# Patient Record
Sex: Female | Born: 1975
Health system: Southern US, Community
[De-identification: ages and names within clinical notes are randomized; demographics above are authoritative.]

## PROBLEM LIST (undated history)

## (undated) DIAGNOSIS — M5020 Other cervical disc displacement, unspecified cervical region: Secondary | ICD-10-CM

## (undated) DIAGNOSIS — M5030 Other cervical disc degeneration, unspecified cervical region: Secondary | ICD-10-CM

## (undated) DIAGNOSIS — M1990 Unspecified osteoarthritis, unspecified site: Secondary | ICD-10-CM

## (undated) HISTORY — DX: Unspecified osteoarthritis, unspecified site: M19.90

## (undated) HISTORY — DX: Other cervical disc displacement, unspecified cervical region: M50.20

## (undated) HISTORY — DX: Other cervical disc degeneration, unspecified cervical region: M50.30

---

## 2005-04-12 ENCOUNTER — Inpatient Hospital Stay: Payer: Self-pay | Admitting: Unknown Physician Specialty

## 2012-11-14 DIAGNOSIS — M1990 Unspecified osteoarthritis, unspecified site: Secondary | ICD-10-CM

## 2012-11-14 HISTORY — DX: Unspecified osteoarthritis, unspecified site: M19.90

## 2013-11-28 ENCOUNTER — Encounter: Payer: Self-pay | Admitting: General Practice

## 2013-12-15 ENCOUNTER — Encounter: Payer: Self-pay | Admitting: General Practice

## 2014-01-12 ENCOUNTER — Encounter: Payer: Self-pay | Admitting: General Practice

## 2014-02-12 ENCOUNTER — Encounter: Payer: Self-pay | Admitting: General Practice

## 2014-04-11 DIAGNOSIS — M4802 Spinal stenosis, cervical region: Secondary | ICD-10-CM | POA: Insufficient documentation

## 2014-04-11 DIAGNOSIS — M5020 Other cervical disc displacement, unspecified cervical region: Secondary | ICD-10-CM | POA: Insufficient documentation

## 2014-04-11 DIAGNOSIS — M5412 Radiculopathy, cervical region: Secondary | ICD-10-CM | POA: Insufficient documentation

## 2014-09-24 DIAGNOSIS — S335XXA Sprain of ligaments of lumbar spine, initial encounter: Secondary | ICD-10-CM | POA: Insufficient documentation

## 2014-10-10 ENCOUNTER — Ambulatory Visit: Payer: Self-pay | Admitting: Physical Medicine and Rehabilitation

## 2014-10-21 DIAGNOSIS — M5136 Other intervertebral disc degeneration, lumbar region: Secondary | ICD-10-CM | POA: Insufficient documentation

## 2014-10-27 ENCOUNTER — Encounter: Payer: Self-pay | Admitting: Physical Medicine and Rehabilitation

## 2014-11-14 ENCOUNTER — Encounter: Payer: Self-pay | Admitting: Physical Medicine and Rehabilitation

## 2014-12-15 ENCOUNTER — Encounter: Payer: Self-pay | Admitting: Physical Medicine and Rehabilitation

## 2015-01-13 ENCOUNTER — Encounter: Payer: Self-pay | Admitting: Physical Medicine and Rehabilitation

## 2017-03-30 ENCOUNTER — Ambulatory Visit (INDEPENDENT_AMBULATORY_CARE_PROVIDER_SITE_OTHER): Payer: BLUE CROSS/BLUE SHIELD | Admitting: Certified Nurse Midwife

## 2017-03-30 ENCOUNTER — Encounter: Payer: Self-pay | Admitting: Certified Nurse Midwife

## 2017-03-30 VITALS — BP 110/70 | HR 78 | Ht 64.0 in | Wt 177.0 lb

## 2017-03-30 DIAGNOSIS — Z01419 Encounter for gynecological examination (general) (routine) without abnormal findings: Secondary | ICD-10-CM | POA: Diagnosis not present

## 2017-03-30 DIAGNOSIS — Z124 Encounter for screening for malignant neoplasm of cervix: Secondary | ICD-10-CM
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
## 2017-04-05 HISTORY — PX: COLONOSCOPY W/ POLYPECTOMY: SHX1380

## 2017-04-05 LAB — IGP,RFX APTIMA HPV ALL PTH: PAP Smear Comment: 0

## 2017-04-20 ENCOUNTER — Other Ambulatory Visit: Payer: Self-pay | Admitting: Certified Nurse Midwife

## 2017-04-20 DIAGNOSIS — Z1231 Encounter for screening mammogram for malignant neoplasm of breast: Secondary | ICD-10-CM

## 2017-05-05 ENCOUNTER — Telehealth: Payer: Self-pay

## 2017-05-05 ENCOUNTER — Ambulatory Visit
Admission: RE | Admit: 2017-05-05 | Discharge: 2017-05-05 | Disposition: A | Discharge status: Home / Self Care | Payer: BLUE CROSS/BLUE SHIELD | Source: Ambulatory Visit | Attending: Certified Nurse Midwife | Admitting: Certified Nurse Midwife

## 2017-05-05 DIAGNOSIS — Z1231 Encounter for screening mammogram for malignant neoplasm of breast: Secondary | ICD-10-CM | POA: Insufficient documentation

## 2017-05-05 NOTE — Telephone Encounter (Signed)
Pt stated she had a mammogram done here about 5 years ago and she is requesting to pick up a copy of the images on a CD if possible to take to [facility] to compare to her most recent mammogram. Pt would like this as soon as possible b/c her insurance is about to run out and if she has to have more test done she would like to do that before her insurance changes. Please advise. Thanks TNP

## 2017-05-09 ENCOUNTER — Inpatient Hospital Stay
Admission: RE | Admit: 2017-05-09 | Discharge: 2017-05-09 | Disposition: A | Discharge status: Home / Self Care | Payer: Self-pay | Source: Ambulatory Visit | Attending: *Deleted | Admitting: *Deleted

## 2017-05-09 ENCOUNTER — Other Ambulatory Visit: Payer: Self-pay | Admitting: *Deleted

## 2017-05-09 DIAGNOSIS — Z9289 Personal history of other medical treatment: Secondary | ICD-10-CM

## 2017-05-09 NOTE — Telephone Encounter (Signed)
Spoke w/pt. Notified that her Mammogram reports & CD will be available for [place] to p/u from our front desk today. [place] aware & Pt does not need to p/u herself.

## 2017-05-11 ENCOUNTER — Ambulatory Visit: Payer: Self-pay

## 2017-05-12 ENCOUNTER — Telehealth: Payer: Self-pay

## 2017-05-12 NOTE — Telephone Encounter (Signed)
Pt calling for report from [place].  Her deductible that she has met runs out today.  Wants to be sure she doesn't have to have the image redone.  [phone].   Pt aware mammogram neg.

## 2017-06-02 ENCOUNTER — Ambulatory Visit
Admission: RE | Admit: 2017-06-02 | Discharge: 2017-06-02 | Disposition: A | Discharge status: Home / Self Care | Payer: Worker's Compensation | Source: Ambulatory Visit | Attending: Family Medicine | Admitting: Family Medicine

## 2017-06-02 ENCOUNTER — Other Ambulatory Visit: Payer: Self-pay | Admitting: Family Medicine

## 2017-06-02 DIAGNOSIS — R52 Pain, unspecified: Secondary | ICD-10-CM

## 2017-06-02 DIAGNOSIS — W19XXXA Unspecified fall, initial encounter: Secondary | ICD-10-CM | POA: Insufficient documentation

## 2017-06-02 DIAGNOSIS — M25511 Pain in right shoulder: Secondary | ICD-10-CM | POA: Diagnosis not present

## 2017-07-11 ENCOUNTER — Ambulatory Visit: Payer: Worker's Compensation | Attending: Family Medicine

## 2017-07-11 DIAGNOSIS — M25511 Pain in right shoulder: Secondary | ICD-10-CM | POA: Insufficient documentation

## 2017-07-11 DIAGNOSIS — M25611 Stiffness of right shoulder, not elsewhere classified: Secondary | ICD-10-CM | POA: Diagnosis not present
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
## 2017-07-19 ENCOUNTER — Ambulatory Visit: Payer: Worker's Compensation | Attending: Family Medicine

## 2017-07-19 DIAGNOSIS — M25511 Pain in right shoulder: Secondary | ICD-10-CM | POA: Diagnosis present

## 2017-07-19 DIAGNOSIS — M25611 Stiffness of right shoulder, not elsewhere classified: Secondary | ICD-10-CM | POA: Diagnosis not present
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
## 2017-07-25 ENCOUNTER — Ambulatory Visit: Payer: Worker's Compensation

## 2017-07-25 DIAGNOSIS — M25611 Stiffness of right shoulder, not elsewhere classified: Secondary | ICD-10-CM | POA: Diagnosis not present

## 2017-07-25 DIAGNOSIS — M25511 Pain in right shoulder: Secondary | ICD-10-CM
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
## 2017-08-03 ENCOUNTER — Ambulatory Visit: Payer: Worker's Compensation | Admitting: Physical Therapy

## 2017-08-03 ENCOUNTER — Encounter: Payer: Self-pay | Admitting: Physical Therapy

## 2017-08-03 DIAGNOSIS — M25611 Stiffness of right shoulder, not elsewhere classified: Secondary | ICD-10-CM

## 2017-08-03 DIAGNOSIS — M25511 Pain in right shoulder: Secondary | ICD-10-CM
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
## 2017-08-07 ENCOUNTER — Ambulatory Visit: Payer: Worker's Compensation

## 2017-08-07 DIAGNOSIS — M25511 Pain in right shoulder: Secondary | ICD-10-CM

## 2017-08-07 DIAGNOSIS — M25611 Stiffness of right shoulder, not elsewhere classified: Secondary | ICD-10-CM | POA: Diagnosis not present
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
## 2017-08-10 ENCOUNTER — Ambulatory Visit: Payer: Worker's Compensation

## 2017-08-10 DIAGNOSIS — M25611 Stiffness of right shoulder, not elsewhere classified: Secondary | ICD-10-CM

## 2017-08-10 DIAGNOSIS — M25511 Pain in right shoulder: Secondary | ICD-10-CM
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
## 2017-08-23 ENCOUNTER — Ambulatory Visit: Payer: Worker's Compensation | Attending: Family Medicine

## 2017-08-23 DIAGNOSIS — M25611 Stiffness of right shoulder, not elsewhere classified: Secondary | ICD-10-CM | POA: Insufficient documentation

## 2017-08-23 DIAGNOSIS — M25511 Pain in right shoulder: Secondary | ICD-10-CM | POA: Insufficient documentation
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
## 2017-08-30 ENCOUNTER — Ambulatory Visit: Payer: Worker's Compensation

## 2017-08-30 DIAGNOSIS — M25511 Pain in right shoulder: Secondary | ICD-10-CM

## 2017-08-30 DIAGNOSIS — M25611 Stiffness of right shoulder, not elsewhere classified: Secondary | ICD-10-CM
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
## 2017-09-04 ENCOUNTER — Ambulatory Visit: Payer: Worker's Compensation

## 2017-09-04 DIAGNOSIS — M25611 Stiffness of right shoulder, not elsewhere classified: Secondary | ICD-10-CM

## 2017-09-04 DIAGNOSIS — M25511 Pain in right shoulder: Secondary | ICD-10-CM
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
## 2017-09-11 ENCOUNTER — Ambulatory Visit: Payer: Worker's Compensation

## 2017-09-11 DIAGNOSIS — M25511 Pain in right shoulder: Secondary | ICD-10-CM | POA: Diagnosis not present

## 2017-09-11 DIAGNOSIS — M25611 Stiffness of right shoulder, not elsewhere classified: Secondary | ICD-10-CM
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
## 2017-09-25 ENCOUNTER — Ambulatory Visit: Payer: Worker's Compensation | Attending: Family Medicine

## 2017-09-25 ENCOUNTER — Other Ambulatory Visit: Payer: Self-pay

## 2017-09-25 DIAGNOSIS — M25511 Pain in right shoulder: Secondary | ICD-10-CM | POA: Diagnosis present

## 2017-09-25 DIAGNOSIS — M25611 Stiffness of right shoulder, not elsewhere classified: Secondary | ICD-10-CM | POA: Diagnosis not present
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
## 2017-10-04 ENCOUNTER — Ambulatory Visit: Payer: Worker's Compensation

## 2017-10-18 ENCOUNTER — Ambulatory Visit: Payer: Worker's Compensation | Attending: Family Medicine

## 2017-10-18 DIAGNOSIS — M25511 Pain in right shoulder: Secondary | ICD-10-CM | POA: Diagnosis present

## 2017-10-18 DIAGNOSIS — M25611 Stiffness of right shoulder, not elsewhere classified: Secondary | ICD-10-CM | POA: Insufficient documentation
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
## 2017-10-31 ENCOUNTER — Ambulatory Visit: Payer: Worker's Compensation

## 2017-10-31 DIAGNOSIS — M25511 Pain in right shoulder: Secondary | ICD-10-CM | POA: Diagnosis not present

## 2017-10-31 DIAGNOSIS — M25611 Stiffness of right shoulder, not elsewhere classified: Secondary | ICD-10-CM
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
## 2017-11-16 ENCOUNTER — Ambulatory Visit: Payer: Worker's Compensation | Attending: Family Medicine

## 2017-11-16 DIAGNOSIS — M25511 Pain in right shoulder: Secondary | ICD-10-CM | POA: Diagnosis not present

## 2017-11-16 DIAGNOSIS — M25611 Stiffness of right shoulder, not elsewhere classified: Secondary | ICD-10-CM | POA: Diagnosis present
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
## 2017-11-20 ENCOUNTER — Ambulatory Visit: Payer: Worker's Compensation

## 2017-11-29 ENCOUNTER — Ambulatory Visit: Payer: Worker's Compensation

## 2017-12-01 IMAGING — CR DG SHOULDER 2+V*R*
1 series · 3 of 3 positions shown · non-contrast
Comparison: None.

CLINICAL DATA: Slip and fall 2 months ago with right shoulder pain,
initial encounter

EXAM:
RIGHT SHOULDER - 2+ VIEW

[Series 1: dg shoulder right · 0.14mm/px · 3 of 3 slices shown]
[im 1/3]
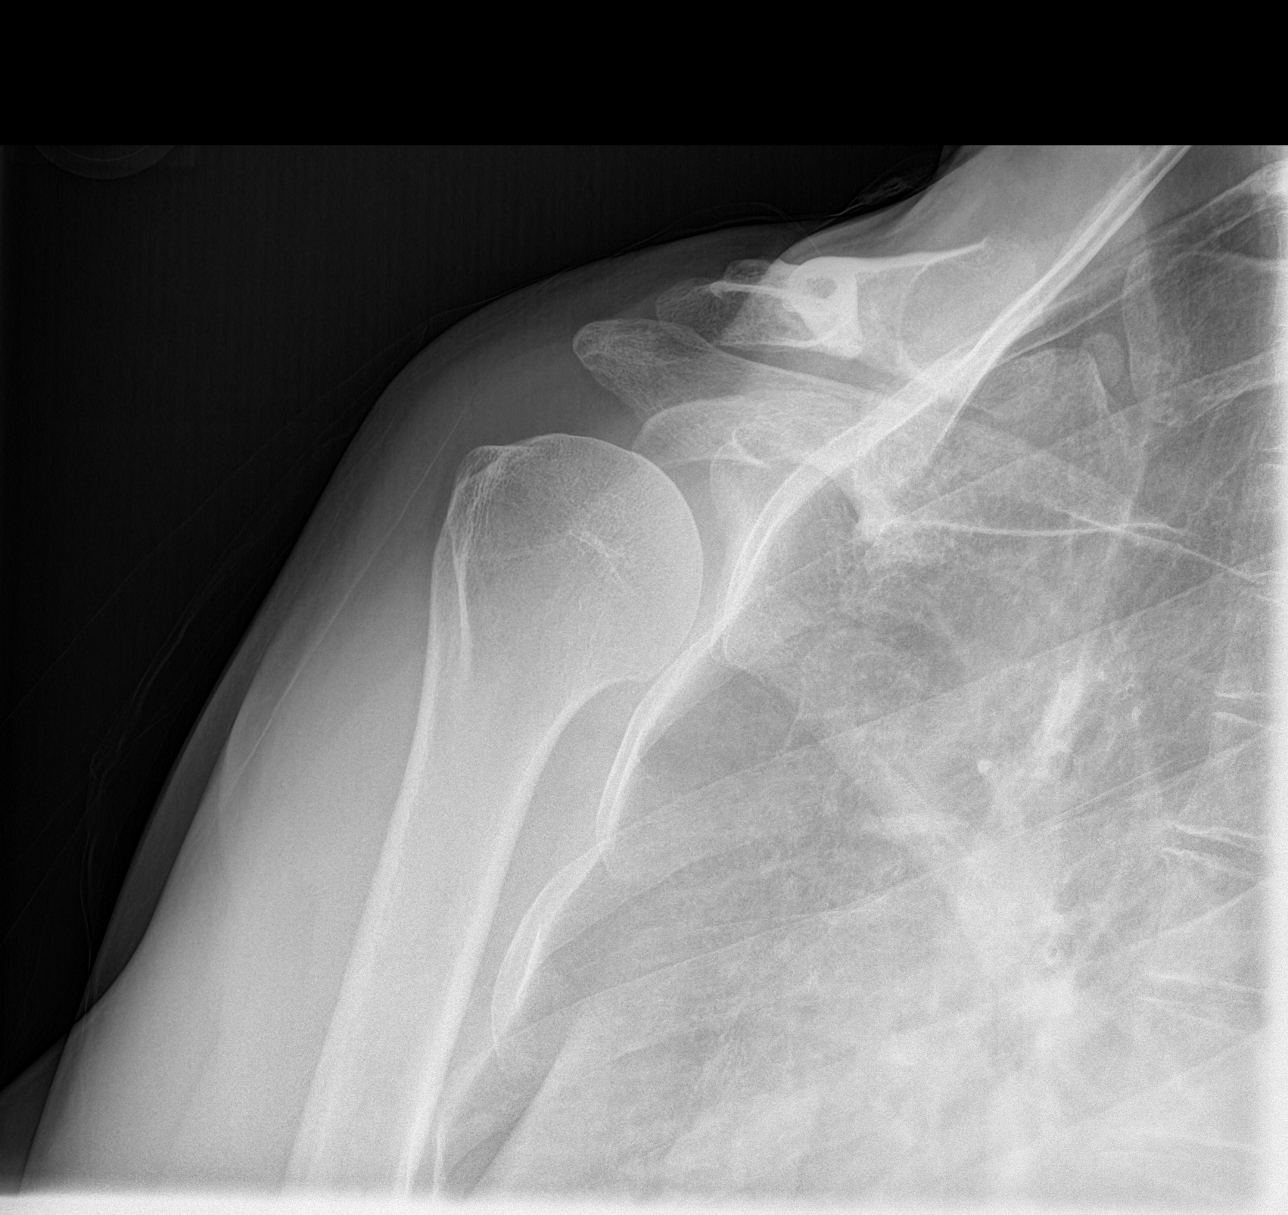
[im 2/3]
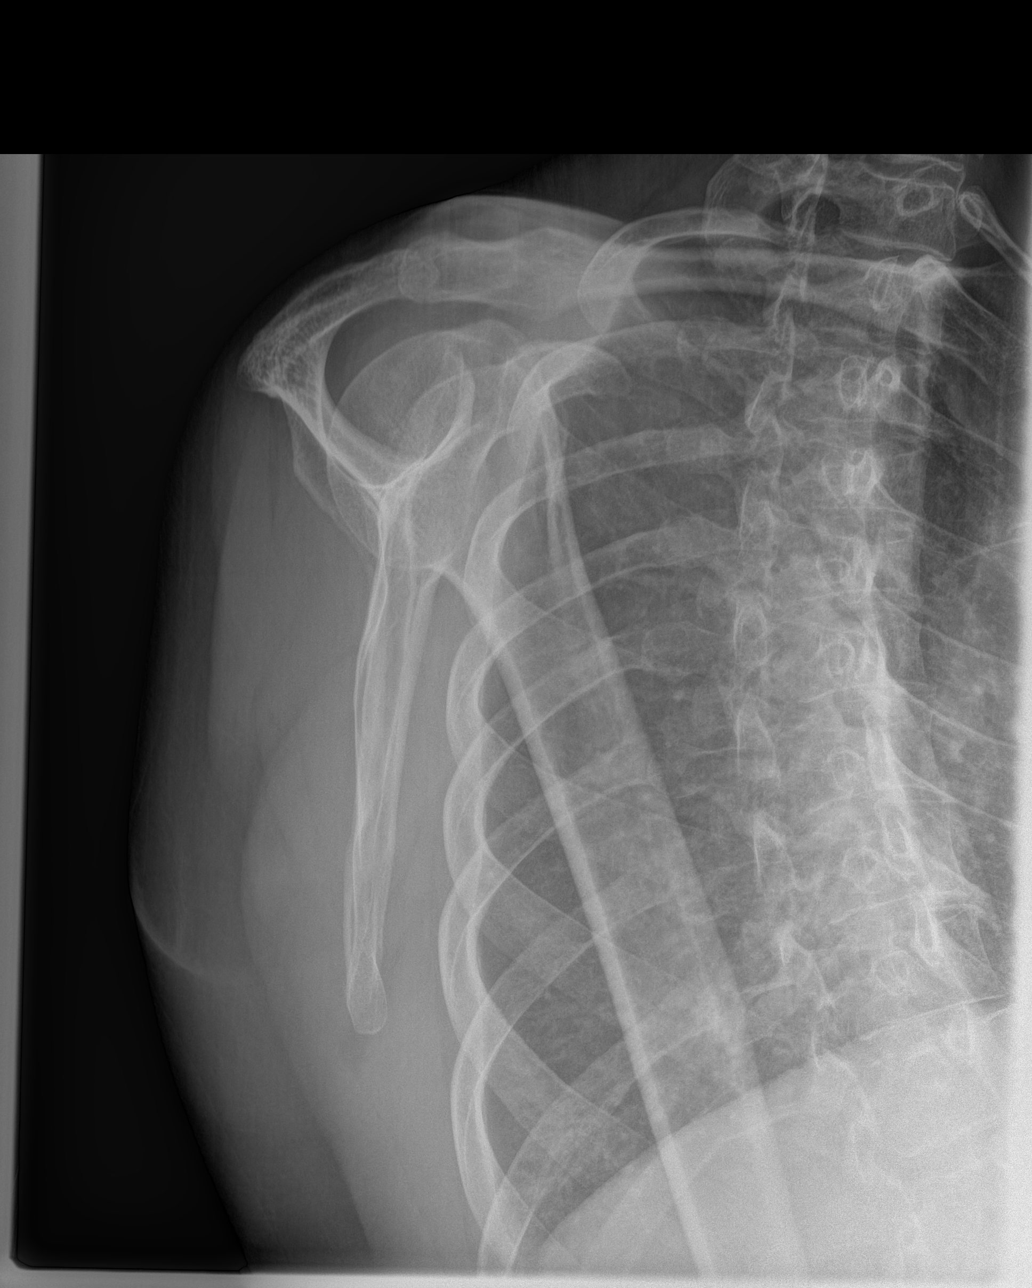
[im 3/3]
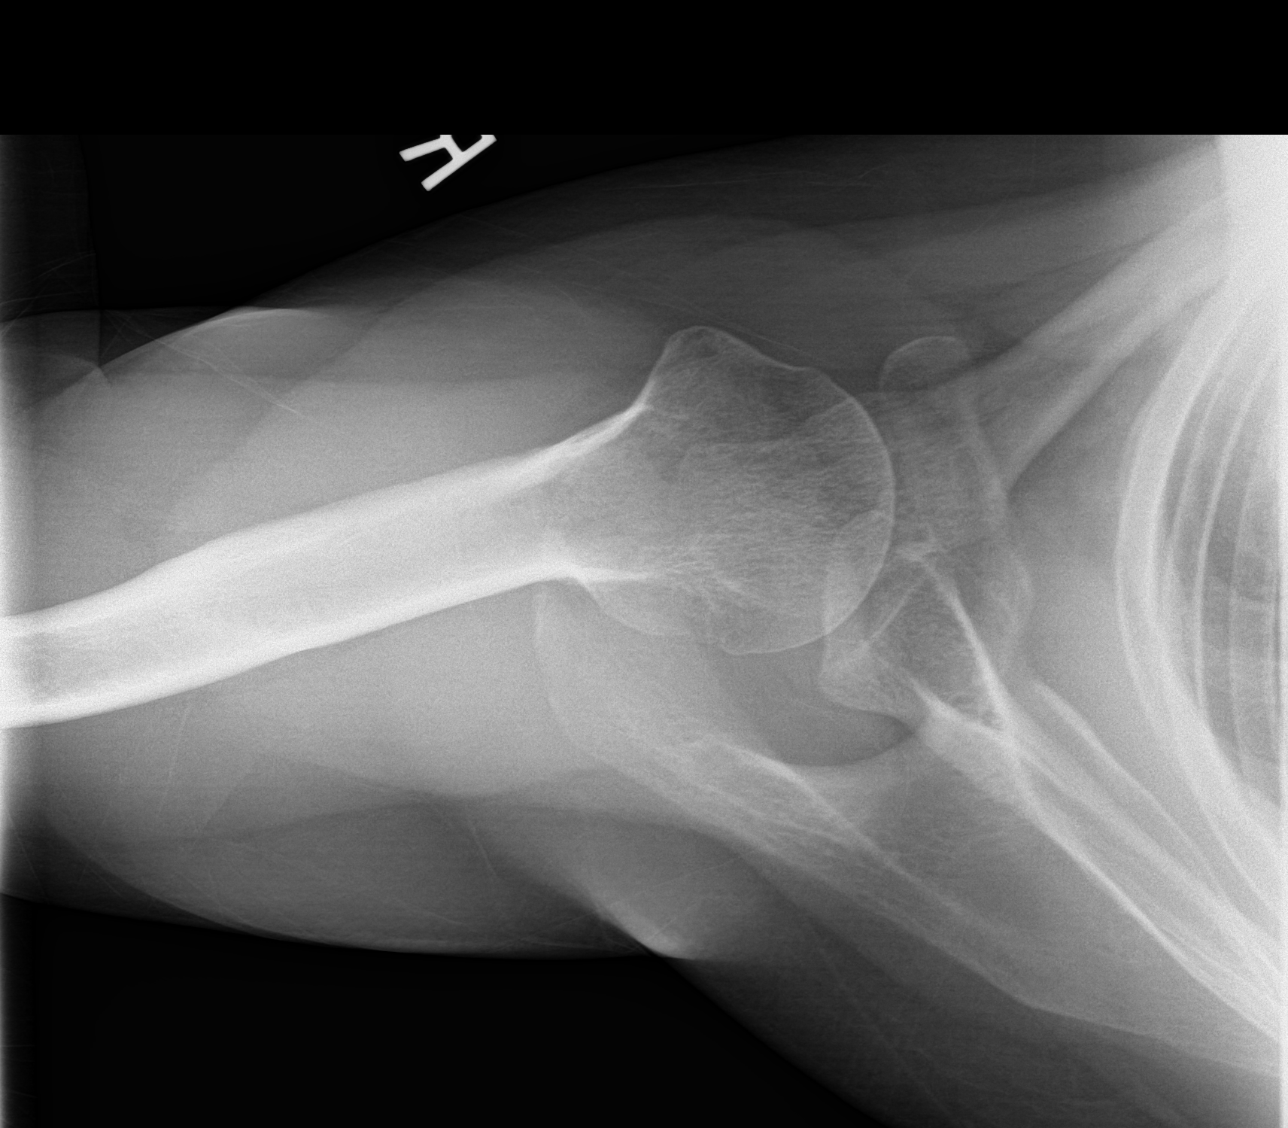

[3 of 3 positions shown; findings below may reference images not displayed]

FINDINGS: No acute fracture or dislocation is noted. No soft tissue
abnormality is seen. The underlying bony thorax is within normal
limits.
IMPRESSION: No acute abnormality noted.

## 2017-12-07 ENCOUNTER — Ambulatory Visit: Payer: Worker's Compensation

## 2017-12-07 DIAGNOSIS — M25611 Stiffness of right shoulder, not elsewhere classified: Secondary | ICD-10-CM

## 2017-12-07 DIAGNOSIS — M25511 Pain in right shoulder: Secondary | ICD-10-CM
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
## 2017-12-27 ENCOUNTER — Ambulatory Visit: Payer: Worker's Compensation | Attending: Family Medicine

## 2017-12-27 DIAGNOSIS — M25511 Pain in right shoulder: Secondary | ICD-10-CM | POA: Insufficient documentation

## 2017-12-27 DIAGNOSIS — M25611 Stiffness of right shoulder, not elsewhere classified: Secondary | ICD-10-CM | POA: Diagnosis not present
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
## 2018-01-16 ENCOUNTER — Ambulatory Visit: Payer: No Typology Code available for payment source | Attending: Family Medicine

## 2018-01-16 DIAGNOSIS — M25611 Stiffness of right shoulder, not elsewhere classified: Secondary | ICD-10-CM | POA: Diagnosis not present

## 2018-01-16 DIAGNOSIS — M25511 Pain in right shoulder: Secondary | ICD-10-CM | POA: Diagnosis present

## 2018-01-16 NOTE — Addendum Note (Signed)
Addended by: Blain Pais on: 01/16/2018 06:40 PM   Modules accepted: Orders
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
## 2018-01-23 ENCOUNTER — Ambulatory Visit: Payer: No Typology Code available for payment source

## 2018-01-23 DIAGNOSIS — M25511 Pain in right shoulder: Secondary | ICD-10-CM

## 2018-01-23 DIAGNOSIS — M25611 Stiffness of right shoulder, not elsewhere classified: Secondary | ICD-10-CM | POA: Diagnosis not present
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
## 2018-01-30 ENCOUNTER — Other Ambulatory Visit: Payer: Self-pay | Admitting: Certified Nurse Midwife

## 2018-01-30 ENCOUNTER — Ambulatory Visit: Payer: No Typology Code available for payment source

## 2018-01-30 ENCOUNTER — Other Ambulatory Visit: Payer: Self-pay | Admitting: Family Medicine

## 2018-01-30 DIAGNOSIS — Z1231 Encounter for screening mammogram for malignant neoplasm of breast: Secondary | ICD-10-CM

## 2018-01-30 DIAGNOSIS — M25611 Stiffness of right shoulder, not elsewhere classified: Secondary | ICD-10-CM

## 2018-01-30 DIAGNOSIS — M25511 Pain in right shoulder: Secondary | ICD-10-CM
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
## 2018-02-06 ENCOUNTER — Ambulatory Visit: Payer: No Typology Code available for payment source

## 2018-02-13 ENCOUNTER — Ambulatory Visit: Payer: No Typology Code available for payment source | Attending: Family Medicine

## 2018-02-13 DIAGNOSIS — M25511 Pain in right shoulder: Secondary | ICD-10-CM

## 2018-02-13 DIAGNOSIS — M25611 Stiffness of right shoulder, not elsewhere classified: Secondary | ICD-10-CM | POA: Insufficient documentation
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
## 2018-02-22 ENCOUNTER — Ambulatory Visit
Admission: RE | Admit: 2018-02-22 | Discharge: 2018-02-22 | Disposition: A | Discharge status: Home / Self Care | Payer: BLUE CROSS/BLUE SHIELD | Source: Ambulatory Visit | Attending: Certified Nurse Midwife | Admitting: Certified Nurse Midwife

## 2018-02-22 DIAGNOSIS — Z1231 Encounter for screening mammogram for malignant neoplasm of breast: Secondary | ICD-10-CM | POA: Insufficient documentation

## 2018-03-26 ENCOUNTER — Encounter: Payer: Self-pay | Admitting: Certified Nurse Midwife

## 2018-03-26 ENCOUNTER — Ambulatory Visit (INDEPENDENT_AMBULATORY_CARE_PROVIDER_SITE_OTHER): Payer: BLUE CROSS/BLUE SHIELD | Admitting: Certified Nurse Midwife

## 2018-03-26 VITALS — BP 112/76 | HR 78 | Ht 64.0 in | Wt 179.0 lb

## 2018-03-26 DIAGNOSIS — Z800 Family history of malignant neoplasm of digestive organs: Secondary | ICD-10-CM | POA: Insufficient documentation

## 2018-03-26 DIAGNOSIS — D369 Benign neoplasm, unspecified site: Secondary | ICD-10-CM | POA: Insufficient documentation

## 2018-03-26 DIAGNOSIS — Z01419 Encounter for gynecological examination (general) (routine) without abnormal findings: Secondary | ICD-10-CM

## 2018-03-26 DIAGNOSIS — M25511 Pain in right shoulder: Secondary | ICD-10-CM | POA: Insufficient documentation

## 2018-03-26 DIAGNOSIS — Z124 Encounter for screening for malignant neoplasm of cervix: Secondary | ICD-10-CM
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
## 2018-03-29 LAB — IGP,RFX APTIMA HPV ALL PTH: PAP Smear Comment: 0

## 2018-06-13 ENCOUNTER — Ambulatory Visit: Payer: No Typology Code available for payment source | Attending: Orthopedic Surgery

## 2018-06-13 DIAGNOSIS — M25611 Stiffness of right shoulder, not elsewhere classified: Secondary | ICD-10-CM | POA: Diagnosis not present

## 2018-06-13 DIAGNOSIS — M25511 Pain in right shoulder: Secondary | ICD-10-CM | POA: Diagnosis present
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
## 2018-06-19 ENCOUNTER — Ambulatory Visit: Payer: No Typology Code available for payment source | Attending: Orthopedic Surgery

## 2018-06-19 DIAGNOSIS — M25511 Pain in right shoulder: Secondary | ICD-10-CM | POA: Insufficient documentation

## 2018-06-19 DIAGNOSIS — M25611 Stiffness of right shoulder, not elsewhere classified: Secondary | ICD-10-CM | POA: Diagnosis present
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
## 2018-07-04 ENCOUNTER — Ambulatory Visit: Payer: No Typology Code available for payment source

## 2018-07-04 DIAGNOSIS — M25511 Pain in right shoulder: Secondary | ICD-10-CM | POA: Diagnosis not present

## 2018-07-04 DIAGNOSIS — M25611 Stiffness of right shoulder, not elsewhere classified: Secondary | ICD-10-CM
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
## 2018-07-11 ENCOUNTER — Ambulatory Visit: Payer: No Typology Code available for payment source

## 2018-07-11 DIAGNOSIS — M25511 Pain in right shoulder: Secondary | ICD-10-CM

## 2018-07-11 DIAGNOSIS — M25611 Stiffness of right shoulder, not elsewhere classified: Secondary | ICD-10-CM
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
## 2018-07-19 ENCOUNTER — Ambulatory Visit: Payer: No Typology Code available for payment source | Attending: Orthopedic Surgery

## 2018-07-19 DIAGNOSIS — M25511 Pain in right shoulder: Secondary | ICD-10-CM | POA: Diagnosis present

## 2018-07-19 DIAGNOSIS — M25611 Stiffness of right shoulder, not elsewhere classified: Secondary | ICD-10-CM
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
## 2018-07-25 ENCOUNTER — Ambulatory Visit: Payer: No Typology Code available for payment source

## 2018-07-25 DIAGNOSIS — M25611 Stiffness of right shoulder, not elsewhere classified: Secondary | ICD-10-CM | POA: Diagnosis not present

## 2018-07-25 DIAGNOSIS — M25511 Pain in right shoulder: Secondary | ICD-10-CM
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
## 2018-07-26 ENCOUNTER — Ambulatory Visit: Payer: No Typology Code available for payment source

## 2018-08-02 ENCOUNTER — Ambulatory Visit: Payer: No Typology Code available for payment source

## 2018-08-02 DIAGNOSIS — M25511 Pain in right shoulder: Secondary | ICD-10-CM

## 2018-08-02 DIAGNOSIS — M25611 Stiffness of right shoulder, not elsewhere classified: Secondary | ICD-10-CM | POA: Diagnosis not present
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
## 2018-08-09 ENCOUNTER — Ambulatory Visit: Payer: No Typology Code available for payment source

## 2018-08-09 DIAGNOSIS — M25611 Stiffness of right shoulder, not elsewhere classified: Secondary | ICD-10-CM | POA: Diagnosis not present

## 2018-08-09 DIAGNOSIS — M25511 Pain in right shoulder: Secondary | ICD-10-CM
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
## 2018-08-16 ENCOUNTER — Ambulatory Visit: Payer: No Typology Code available for payment source

## 2018-08-23 ENCOUNTER — Ambulatory Visit: Payer: No Typology Code available for payment source | Attending: Orthopedic Surgery

## 2018-08-23 DIAGNOSIS — M25511 Pain in right shoulder: Secondary | ICD-10-CM | POA: Diagnosis present

## 2018-08-23 DIAGNOSIS — M25611 Stiffness of right shoulder, not elsewhere classified: Secondary | ICD-10-CM | POA: Diagnosis present

## 2018-08-23 IMAGING — MG MM DIGITAL SCREENING BILAT W/ TOMO W/ CAD
8 of 13 series · 8 of 29 positions shown · non-contrast
Comparison: Previous exam(s).

CLINICAL DATA: Screening.

EXAM:
DIGITAL SCREENING BILATERAL MAMMOGRAM WITH TOMO AND CAD

[L XCCL]
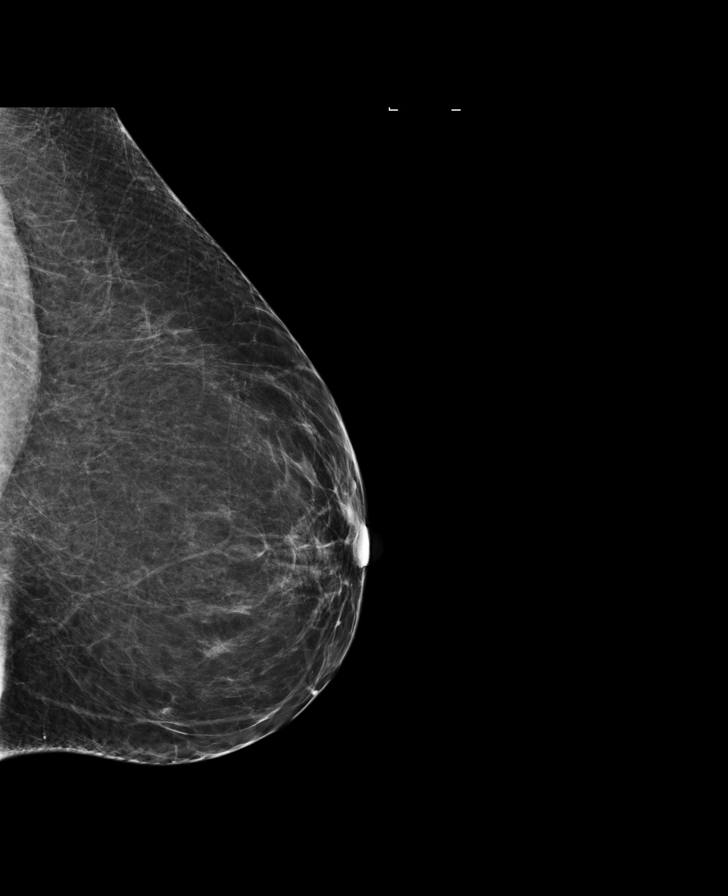

[R CC]
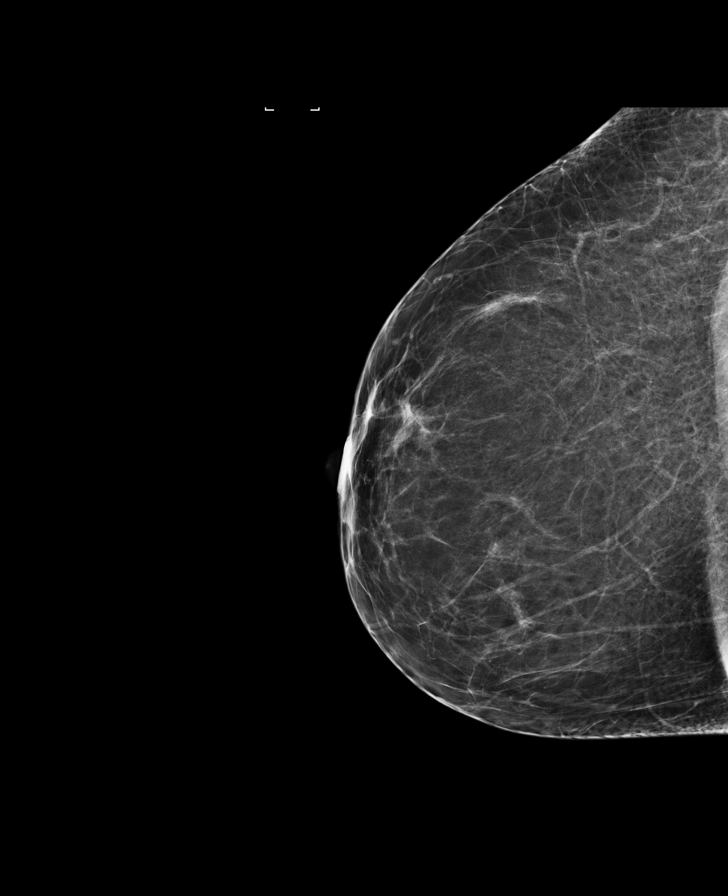

[L CC]
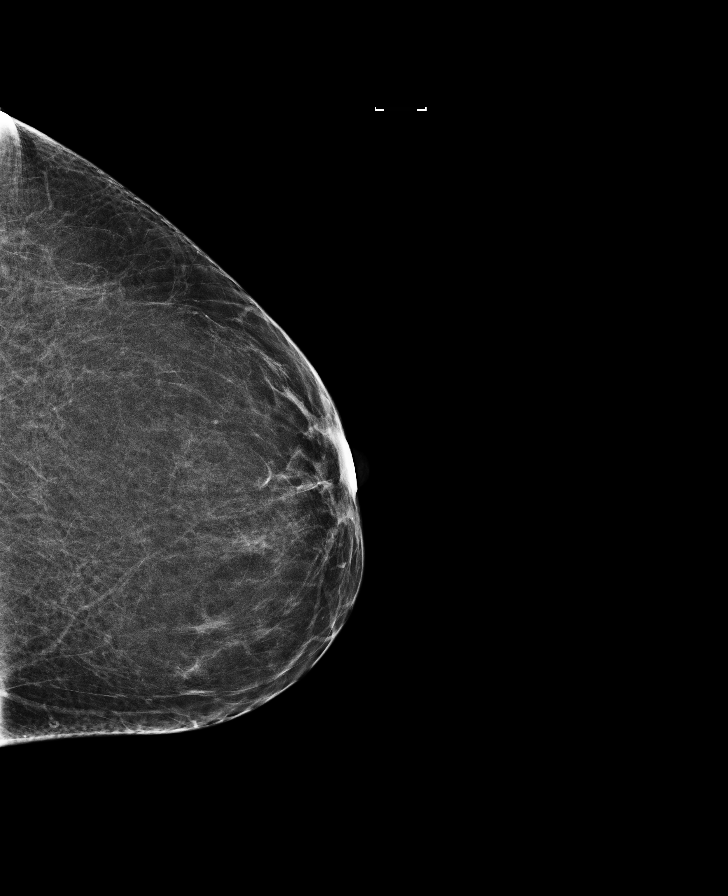

[R CC synth-2D]
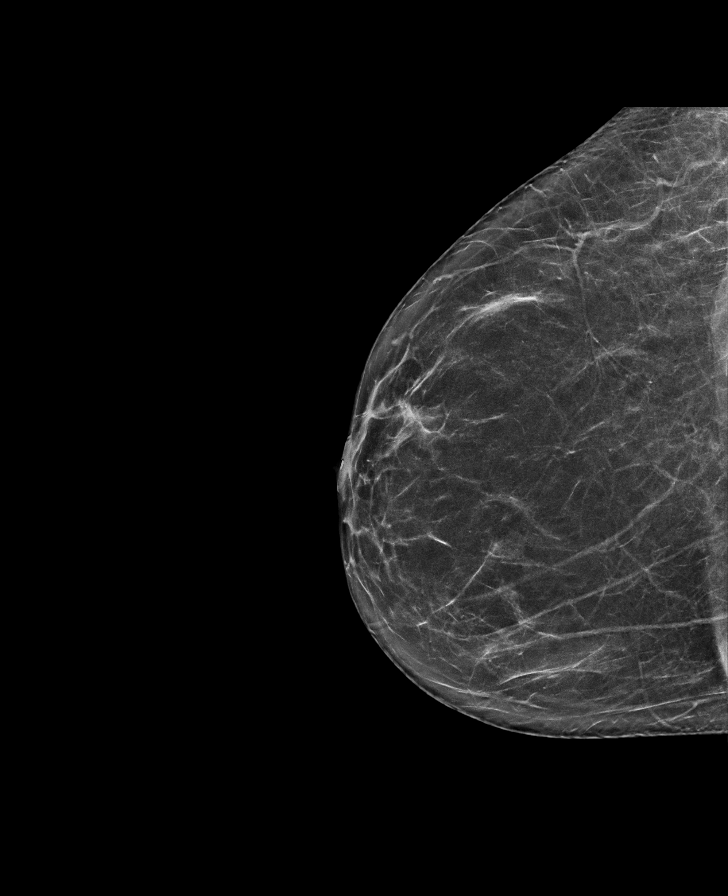

[L MLO synth-2D]
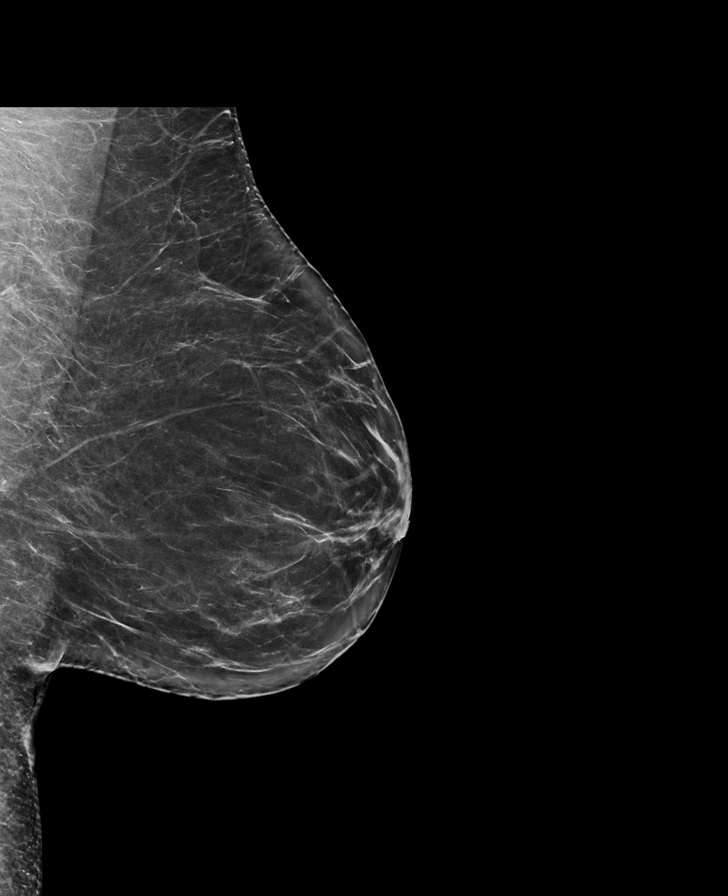

[L CC synth-2D]
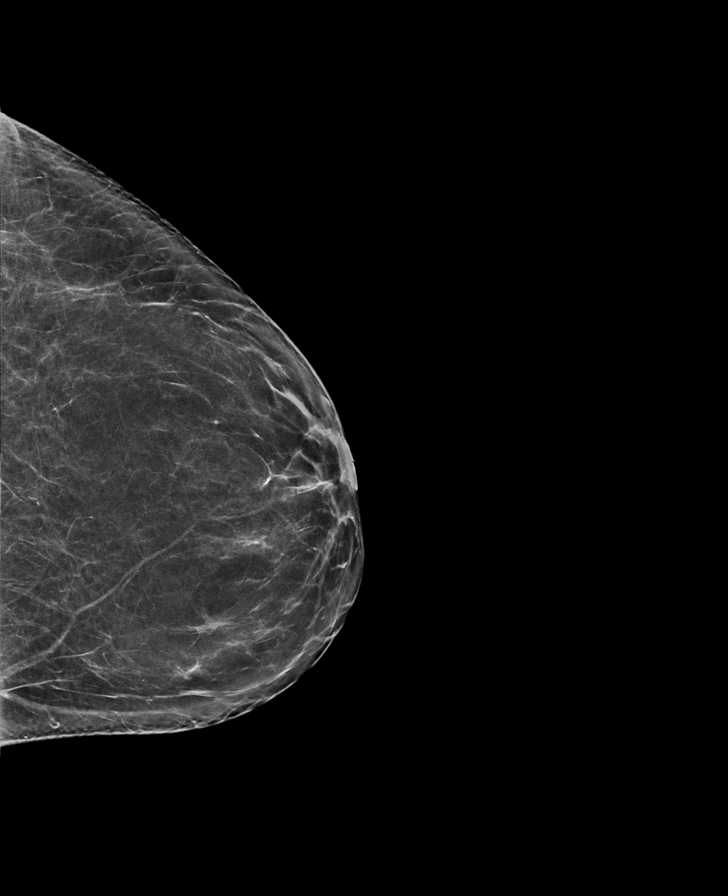

[R MLO]
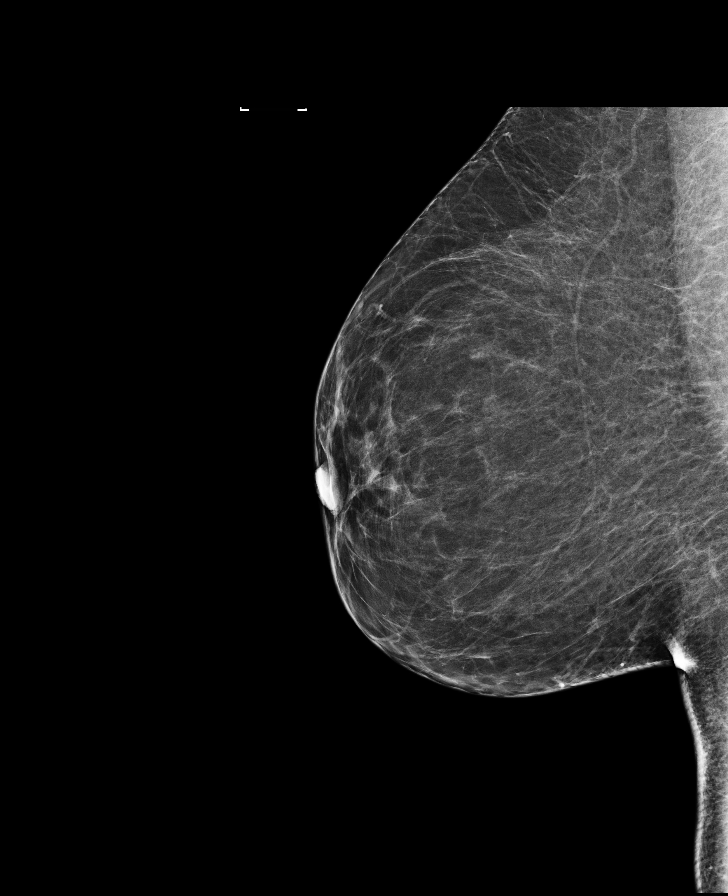

[R MLO synth-2D]
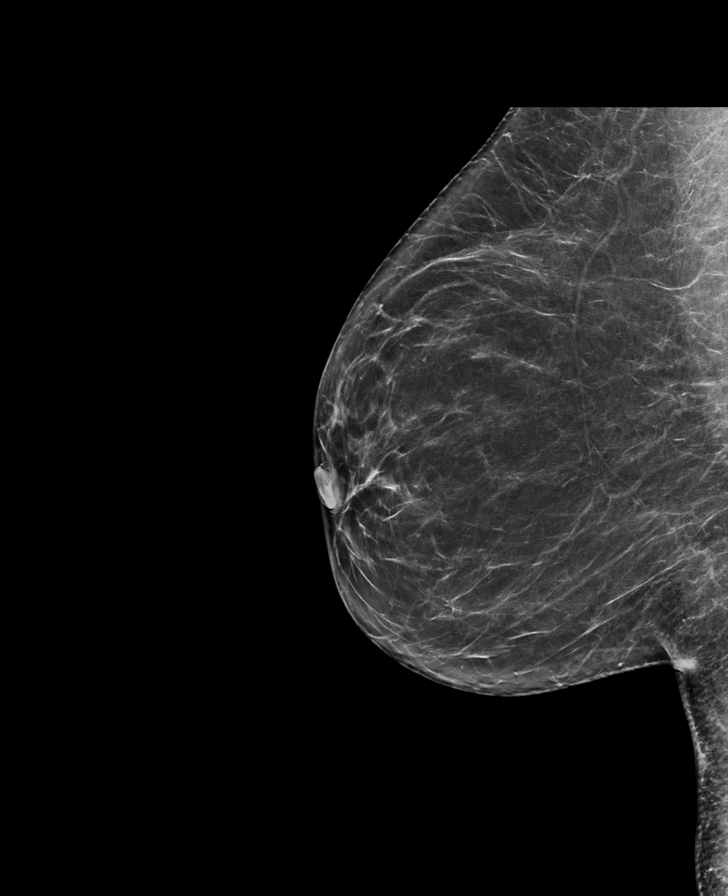

[8 of 29 positions shown; findings below may reference images not displayed]

ACR Breast Density Category b: There are scattered areas of
fibroglandular density.
FINDINGS: There are no findings suspicious for malignancy. Images were
processed with CAD.
IMPRESSION: No mammographic evidence of malignancy. A result letter of this
screening mammogram will be mailed directly to the patient.

RECOMMENDATION:
Screening mammogram in one year. (Code:[id number])

BI-RADS CATEGORY  1: Negative.
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
## 2018-08-30 ENCOUNTER — Ambulatory Visit: Payer: No Typology Code available for payment source

## 2018-08-30 DIAGNOSIS — M25611 Stiffness of right shoulder, not elsewhere classified: Secondary | ICD-10-CM

## 2018-08-30 DIAGNOSIS — M25511 Pain in right shoulder: Secondary | ICD-10-CM | POA: Diagnosis not present
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
## 2018-09-06 ENCOUNTER — Ambulatory Visit: Payer: No Typology Code available for payment source

## 2018-09-06 DIAGNOSIS — M25611 Stiffness of right shoulder, not elsewhere classified: Secondary | ICD-10-CM

## 2018-09-06 DIAGNOSIS — M25511 Pain in right shoulder: Secondary | ICD-10-CM | POA: Diagnosis not present
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
## 2018-09-12 ENCOUNTER — Ambulatory Visit: Payer: No Typology Code available for payment source

## 2018-10-01 ENCOUNTER — Ambulatory Visit: Payer: No Typology Code available for payment source

## 2018-10-09 ENCOUNTER — Ambulatory Visit: Payer: No Typology Code available for payment source | Attending: Orthopedic Surgery

## 2018-10-09 DIAGNOSIS — M25611 Stiffness of right shoulder, not elsewhere classified: Secondary | ICD-10-CM | POA: Diagnosis present

## 2018-10-09 DIAGNOSIS — M25511 Pain in right shoulder: Secondary | ICD-10-CM | POA: Insufficient documentation

## 2018-10-09 NOTE — Addendum Note (Signed)
Addended by: Blain Pais on: 10/09/2018 01:41 PM   Modules accepted: Orders
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
## 2018-12-11 ENCOUNTER — Ambulatory Visit: Payer: No Typology Code available for payment source | Attending: Specialist

## 2018-12-11 DIAGNOSIS — M25511 Pain in right shoulder: Secondary | ICD-10-CM | POA: Insufficient documentation

## 2018-12-11 DIAGNOSIS — G8929 Other chronic pain: Secondary | ICD-10-CM | POA: Diagnosis present

## 2018-12-11 DIAGNOSIS — M25611 Stiffness of right shoulder, not elsewhere classified: Secondary | ICD-10-CM | POA: Diagnosis present

## 2018-12-12 ENCOUNTER — Telehealth: Payer: Self-pay

## 2018-12-12 NOTE — Telephone Encounter (Signed)
Pt started period about a week early which is unusual; 9-10d later had spotting.  [phone]  Pt states cycles have been 27d,28d,25d, 28d, 22d. LMP 11/29/18. Spotted last week which was 3d later.  Today is like another period is starting.  She is having stress c her child/school.  Has the heaviness she feels with her periods.  Monogamous relationship; vasectomy.  Realizes it could be beginning of perimenopause; possible preg.  Adv to take preg test.  Pt will wait a day or two and see how she feels before scheduling appt.  Will do preg test as well.
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
## 2018-12-14 ENCOUNTER — Telehealth: Payer: Self-pay

## 2018-12-14 NOTE — Telephone Encounter (Signed)
Pt states spotting has turned in to a full blown period.  It was a 14 day cycle.  Wants to clarify.  [phone]  Pt's preg test was negative.  Has a lot of stress right now c her child and her job.  Wants to know if perimenopausal.  Adv I could not dx her over the phone but it was possible.  Suggested for her peace of mind to schedule appt c [facility] to discuss.  Tx'd to [facility] for scheduling.

## 2018-12-17 ENCOUNTER — Ambulatory Visit: Payer: No Typology Code available for payment source

## 2018-12-17 ENCOUNTER — Ambulatory Visit (INDEPENDENT_AMBULATORY_CARE_PROVIDER_SITE_OTHER): Payer: BLUE CROSS/BLUE SHIELD | Admitting: Certified Nurse Midwife

## 2018-12-17 ENCOUNTER — Encounter: Payer: Self-pay | Admitting: Certified Nurse Midwife

## 2018-12-17 ENCOUNTER — Other Ambulatory Visit: Payer: Self-pay

## 2018-12-17 VITALS — BP 130/78 | HR 94 | Ht 64.0 in | Wt 182.0 lb

## 2018-12-17 DIAGNOSIS — Z01419 Encounter for gynecological examination (general) (routine) without abnormal findings: Secondary | ICD-10-CM | POA: Diagnosis not present

## 2018-12-17 DIAGNOSIS — N939 Abnormal uterine and vaginal bleeding, unspecified: Secondary | ICD-10-CM
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
## 2018-12-19 ENCOUNTER — Ambulatory Visit: Payer: No Typology Code available for payment source | Attending: Orthopedic Surgery

## 2018-12-19 ENCOUNTER — Encounter: Payer: Self-pay | Admitting: Certified Nurse Midwife

## 2018-12-19 DIAGNOSIS — M25511 Pain in right shoulder: Secondary | ICD-10-CM | POA: Diagnosis present

## 2018-12-19 DIAGNOSIS — G8929 Other chronic pain: Secondary | ICD-10-CM | POA: Insufficient documentation

## 2018-12-19 DIAGNOSIS — M25611 Stiffness of right shoulder, not elsewhere classified: Secondary | ICD-10-CM | POA: Diagnosis present
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
## 2018-12-24 ENCOUNTER — Ambulatory Visit: Payer: No Typology Code available for payment source

## 2018-12-24 DIAGNOSIS — M25611 Stiffness of right shoulder, not elsewhere classified: Secondary | ICD-10-CM

## 2018-12-24 DIAGNOSIS — G8929 Other chronic pain: Secondary | ICD-10-CM

## 2018-12-24 DIAGNOSIS — M25511 Pain in right shoulder: Secondary | ICD-10-CM | POA: Diagnosis not present
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
## 2018-12-27 ENCOUNTER — Ambulatory Visit: Payer: No Typology Code available for payment source

## 2018-12-27 DIAGNOSIS — M25511 Pain in right shoulder: Secondary | ICD-10-CM

## 2018-12-27 DIAGNOSIS — G8929 Other chronic pain: Secondary | ICD-10-CM

## 2018-12-27 DIAGNOSIS — M25611 Stiffness of right shoulder, not elsewhere classified: Secondary | ICD-10-CM
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
## 2019-01-07 ENCOUNTER — Ambulatory Visit: Payer: No Typology Code available for payment source

## 2019-01-07 DIAGNOSIS — G8929 Other chronic pain: Secondary | ICD-10-CM

## 2019-01-07 DIAGNOSIS — M25511 Pain in right shoulder: Secondary | ICD-10-CM

## 2019-01-07 DIAGNOSIS — M25611 Stiffness of right shoulder, not elsewhere classified: Secondary | ICD-10-CM
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
## 2019-01-09 ENCOUNTER — Ambulatory Visit: Payer: No Typology Code available for payment source

## 2019-01-14 ENCOUNTER — Ambulatory Visit: Payer: No Typology Code available for payment source | Attending: Orthopedic Surgery

## 2019-01-14 DIAGNOSIS — M25511 Pain in right shoulder: Secondary | ICD-10-CM | POA: Insufficient documentation

## 2019-01-14 DIAGNOSIS — G8929 Other chronic pain: Secondary | ICD-10-CM | POA: Diagnosis present

## 2019-01-14 DIAGNOSIS — M25611 Stiffness of right shoulder, not elsewhere classified: Secondary | ICD-10-CM | POA: Insufficient documentation
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
## 2019-01-22 ENCOUNTER — Other Ambulatory Visit: Payer: Self-pay | Admitting: Certified Nurse Midwife

## 2019-01-22 DIAGNOSIS — Z1231 Encounter for screening mammogram for malignant neoplasm of breast: Secondary | ICD-10-CM

## 2019-01-24 ENCOUNTER — Ambulatory Visit: Payer: No Typology Code available for payment source

## 2019-01-24 ENCOUNTER — Other Ambulatory Visit: Payer: Self-pay

## 2019-01-24 DIAGNOSIS — M25611 Stiffness of right shoulder, not elsewhere classified: Secondary | ICD-10-CM | POA: Diagnosis not present

## 2019-01-24 DIAGNOSIS — G8929 Other chronic pain: Secondary | ICD-10-CM

## 2019-01-24 DIAGNOSIS — M25511 Pain in right shoulder: Secondary | ICD-10-CM
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
## 2019-01-24 NOTE — Addendum Note (Signed)
Addended by: Blain Pais on: 01/24/2019 05:44 PM   Modules accepted: Orders

## 2019-01-29 ENCOUNTER — Ambulatory Visit: Payer: No Typology Code available for payment source

## 2019-01-30 ENCOUNTER — Ambulatory Visit: Payer: No Typology Code available for payment source

## 2019-01-30 ENCOUNTER — Other Ambulatory Visit: Payer: Self-pay

## 2019-01-30 DIAGNOSIS — M25511 Pain in right shoulder: Secondary | ICD-10-CM

## 2019-01-30 DIAGNOSIS — G8929 Other chronic pain: Secondary | ICD-10-CM

## 2019-01-30 DIAGNOSIS — M25611 Stiffness of right shoulder, not elsewhere classified: Secondary | ICD-10-CM
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
## 2019-02-04 ENCOUNTER — Ambulatory Visit: Payer: No Typology Code available for payment source

## 2019-02-11 ENCOUNTER — Ambulatory Visit: Payer: No Typology Code available for payment source

## 2019-02-25 NOTE — Therapy (Signed)
[place] [facility] [address] [place], [place], [address] Phone: [phone]   Fax:  [phone]  Patient Details  Name: Mary Curtis MRN: [id number] Date of Birth: [birth year] Referring Provider:  No ref. provider found  Encounter Date: 02/25/2019  The patient has been contacted today in regards to telehealth services. The patient expressed an interest in participating in telehealth visits. Patient has been informed that an [facility] support representative will be reaching out to them to verify their insurance benefits and for scheduling       Blythe Stanford, PT DPT 02/25/2019, 11:46 AM  [place], [place], [address] Phone: [phone]   Fax:  [phone]

## 2019-03-04 ENCOUNTER — Ambulatory Visit: Payer: No Typology Code available for payment source

## 2019-03-08 ENCOUNTER — Ambulatory Visit: Payer: No Typology Code available for payment source | Attending: Orthopedic Surgery

## 2019-03-08 ENCOUNTER — Other Ambulatory Visit: Payer: Self-pay

## 2019-03-08 DIAGNOSIS — G8929 Other chronic pain: Secondary | ICD-10-CM | POA: Diagnosis present

## 2019-03-08 DIAGNOSIS — M25611 Stiffness of right shoulder, not elsewhere classified: Secondary | ICD-10-CM

## 2019-03-08 DIAGNOSIS — M25511 Pain in right shoulder: Secondary | ICD-10-CM | POA: Diagnosis present
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
## 2019-03-18 ENCOUNTER — Ambulatory Visit: Payer: No Typology Code available for payment source

## 2019-04-10 ENCOUNTER — Ambulatory Visit: Payer: No Typology Code available for payment source | Attending: Specialist

## 2019-04-10 ENCOUNTER — Other Ambulatory Visit: Payer: Self-pay

## 2019-04-10 DIAGNOSIS — M25611 Stiffness of right shoulder, not elsewhere classified: Secondary | ICD-10-CM | POA: Diagnosis not present

## 2019-04-10 DIAGNOSIS — G8929 Other chronic pain: Secondary | ICD-10-CM | POA: Diagnosis present

## 2019-04-10 DIAGNOSIS — M25511 Pain in right shoulder: Secondary | ICD-10-CM | POA: Diagnosis present
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
## 2019-05-08 ENCOUNTER — Ambulatory Visit
Admission: RE | Admit: 2019-05-08 | Discharge: 2019-05-08 | Disposition: A | Discharge status: Home / Self Care | Payer: BC Managed Care – PPO | Source: Ambulatory Visit | Attending: Certified Nurse Midwife | Admitting: Certified Nurse Midwife

## 2019-05-08 ENCOUNTER — Other Ambulatory Visit: Payer: Self-pay

## 2019-05-08 DIAGNOSIS — Z1231 Encounter for screening mammogram for malignant neoplasm of breast: Secondary | ICD-10-CM | POA: Insufficient documentation

## 2019-05-14 NOTE — Therapy (Signed)
[facility] [address] [place], [address] Phone: [phone]   Fax:  [phone]  Patient Details  Name: Mary Curtis MRN: [id number] Date of Birth: 1976/05/07 Referring Provider:  Lovell Sheehan, MD  Encounter Date: 10/01/2018   Patient cancelled this date and chart was opened by Wes Rissell in preparation for session.  No charges are associated and note run to remove chart from open chart list.    Lovett,Amy 05/14/2019, 9:49 AM  [facility] [address] [place], [address] Phone: [phone]   Fax:  [phone]

## 2019-05-15 ENCOUNTER — Other Ambulatory Visit: Payer: Self-pay

## 2019-05-15 ENCOUNTER — Ambulatory Visit: Payer: No Typology Code available for payment source | Attending: Specialist

## 2019-05-15 DIAGNOSIS — M25611 Stiffness of right shoulder, not elsewhere classified: Secondary | ICD-10-CM | POA: Diagnosis present

## 2019-05-15 DIAGNOSIS — M25511 Pain in right shoulder: Secondary | ICD-10-CM | POA: Diagnosis present

## 2019-05-15 DIAGNOSIS — G8929 Other chronic pain: Secondary | ICD-10-CM | POA: Diagnosis present
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
## 2019-06-05 ENCOUNTER — Other Ambulatory Visit: Payer: Self-pay

## 2019-06-05 ENCOUNTER — Ambulatory Visit: Payer: No Typology Code available for payment source

## 2019-06-05 DIAGNOSIS — M25611 Stiffness of right shoulder, not elsewhere classified: Secondary | ICD-10-CM

## 2019-06-05 DIAGNOSIS — M25511 Pain in right shoulder: Secondary | ICD-10-CM

## 2019-06-05 DIAGNOSIS — G8929 Other chronic pain: Secondary | ICD-10-CM

## 2019-06-05 NOTE — Addendum Note (Signed)
Addended by: Blain Pais on: 06/05/2019 01:46 PM   Modules accepted: Orders
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
## 2019-06-11 DIAGNOSIS — J309 Allergic rhinitis, unspecified: Secondary | ICD-10-CM | POA: Diagnosis not present

## 2019-07-03 ENCOUNTER — Ambulatory Visit: Payer: No Typology Code available for payment source | Attending: Orthopedic Surgery

## 2019-07-03 ENCOUNTER — Other Ambulatory Visit: Payer: Self-pay

## 2019-07-03 DIAGNOSIS — G8929 Other chronic pain: Secondary | ICD-10-CM | POA: Diagnosis present

## 2019-07-03 DIAGNOSIS — R252 Cramp and spasm: Secondary | ICD-10-CM | POA: Diagnosis present

## 2019-07-03 DIAGNOSIS — M25511 Pain in right shoulder: Secondary | ICD-10-CM | POA: Insufficient documentation

## 2019-07-03 DIAGNOSIS — M25611 Stiffness of right shoulder, not elsewhere classified: Secondary | ICD-10-CM | POA: Insufficient documentation
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
## 2019-07-04 NOTE — Addendum Note (Signed)
Addended by: Blain Pais on: 07/04/2019 09:12 AM   Modules accepted: Orders

## 2019-07-09 DIAGNOSIS — J301 Allergic rhinitis due to pollen: Secondary | ICD-10-CM | POA: Diagnosis not present

## 2019-07-10 ENCOUNTER — Ambulatory Visit: Payer: No Typology Code available for payment source

## 2019-07-10 ENCOUNTER — Other Ambulatory Visit: Payer: Self-pay

## 2019-07-10 DIAGNOSIS — R252 Cramp and spasm: Secondary | ICD-10-CM

## 2019-07-10 DIAGNOSIS — M25511 Pain in right shoulder: Secondary | ICD-10-CM | POA: Diagnosis not present

## 2019-07-10 DIAGNOSIS — M25611 Stiffness of right shoulder, not elsewhere classified: Secondary | ICD-10-CM

## 2019-07-10 DIAGNOSIS — G8929 Other chronic pain: Secondary | ICD-10-CM
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
## 2019-07-15 DIAGNOSIS — J309 Allergic rhinitis, unspecified: Secondary | ICD-10-CM | POA: Diagnosis not present

## 2019-07-16 ENCOUNTER — Other Ambulatory Visit: Payer: Self-pay

## 2019-07-16 ENCOUNTER — Ambulatory Visit: Payer: No Typology Code available for payment source | Attending: Orthopedic Surgery

## 2019-07-16 DIAGNOSIS — R252 Cramp and spasm: Secondary | ICD-10-CM | POA: Diagnosis present

## 2019-07-16 DIAGNOSIS — G8929 Other chronic pain: Secondary | ICD-10-CM | POA: Diagnosis present

## 2019-07-16 DIAGNOSIS — M25511 Pain in right shoulder: Secondary | ICD-10-CM | POA: Diagnosis present

## 2019-07-16 DIAGNOSIS — M25611 Stiffness of right shoulder, not elsewhere classified: Secondary | ICD-10-CM | POA: Diagnosis present
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
## 2019-07-18 ENCOUNTER — Ambulatory Visit: Payer: No Typology Code available for payment source

## 2019-07-23 ENCOUNTER — Ambulatory Visit: Payer: No Typology Code available for payment source

## 2019-07-29 ENCOUNTER — Other Ambulatory Visit: Payer: Self-pay

## 2019-07-29 ENCOUNTER — Ambulatory Visit: Payer: No Typology Code available for payment source

## 2019-07-29 DIAGNOSIS — M25511 Pain in right shoulder: Secondary | ICD-10-CM | POA: Diagnosis not present

## 2019-07-29 DIAGNOSIS — G8929 Other chronic pain: Secondary | ICD-10-CM

## 2019-07-29 DIAGNOSIS — R252 Cramp and spasm: Secondary | ICD-10-CM
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
## 2019-07-31 ENCOUNTER — Ambulatory Visit: Payer: No Typology Code available for payment source

## 2019-07-31 ENCOUNTER — Other Ambulatory Visit: Payer: Self-pay

## 2019-07-31 DIAGNOSIS — G8929 Other chronic pain: Secondary | ICD-10-CM

## 2019-07-31 DIAGNOSIS — M25511 Pain in right shoulder: Secondary | ICD-10-CM | POA: Diagnosis not present

## 2019-07-31 DIAGNOSIS — R252 Cramp and spasm: Secondary | ICD-10-CM
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
## 2019-08-05 ENCOUNTER — Other Ambulatory Visit: Payer: Self-pay

## 2019-08-05 ENCOUNTER — Ambulatory Visit: Payer: No Typology Code available for payment source

## 2019-08-05 DIAGNOSIS — M25511 Pain in right shoulder: Secondary | ICD-10-CM | POA: Diagnosis not present

## 2019-08-05 DIAGNOSIS — G8929 Other chronic pain: Secondary | ICD-10-CM

## 2019-08-05 DIAGNOSIS — R252 Cramp and spasm: Secondary | ICD-10-CM
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
## 2019-08-07 ENCOUNTER — Other Ambulatory Visit: Payer: Self-pay

## 2019-08-07 ENCOUNTER — Ambulatory Visit: Payer: No Typology Code available for payment source

## 2019-08-07 DIAGNOSIS — R252 Cramp and spasm: Secondary | ICD-10-CM

## 2019-08-07 DIAGNOSIS — M25511 Pain in right shoulder: Secondary | ICD-10-CM

## 2019-08-07 DIAGNOSIS — G8929 Other chronic pain: Secondary | ICD-10-CM
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
## 2019-08-12 ENCOUNTER — Ambulatory Visit: Payer: No Typology Code available for payment source | Attending: Specialist

## 2019-08-12 ENCOUNTER — Other Ambulatory Visit: Payer: Self-pay

## 2019-08-12 DIAGNOSIS — M25511 Pain in right shoulder: Secondary | ICD-10-CM | POA: Insufficient documentation

## 2019-08-12 DIAGNOSIS — G8929 Other chronic pain: Secondary | ICD-10-CM | POA: Insufficient documentation

## 2019-08-12 DIAGNOSIS — R252 Cramp and spasm: Secondary | ICD-10-CM
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
## 2019-08-14 ENCOUNTER — Other Ambulatory Visit: Payer: Self-pay

## 2019-08-14 ENCOUNTER — Ambulatory Visit: Payer: No Typology Code available for payment source | Attending: Specialist

## 2019-08-14 DIAGNOSIS — M25511 Pain in right shoulder: Secondary | ICD-10-CM | POA: Insufficient documentation

## 2019-08-14 DIAGNOSIS — R252 Cramp and spasm: Secondary | ICD-10-CM | POA: Insufficient documentation

## 2019-08-14 DIAGNOSIS — M25611 Stiffness of right shoulder, not elsewhere classified: Secondary | ICD-10-CM | POA: Insufficient documentation

## 2019-08-14 DIAGNOSIS — G8929 Other chronic pain: Secondary | ICD-10-CM | POA: Insufficient documentation
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
## 2019-08-19 ENCOUNTER — Ambulatory Visit: Payer: No Typology Code available for payment source | Attending: Orthopedic Surgery

## 2019-08-19 ENCOUNTER — Other Ambulatory Visit: Payer: Self-pay

## 2019-08-19 DIAGNOSIS — M25611 Stiffness of right shoulder, not elsewhere classified: Secondary | ICD-10-CM | POA: Insufficient documentation

## 2019-08-19 DIAGNOSIS — M25511 Pain in right shoulder: Secondary | ICD-10-CM | POA: Insufficient documentation

## 2019-08-19 DIAGNOSIS — R252 Cramp and spasm: Secondary | ICD-10-CM | POA: Insufficient documentation

## 2019-08-19 DIAGNOSIS — G8929 Other chronic pain: Secondary | ICD-10-CM
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
## 2019-08-27 ENCOUNTER — Ambulatory Visit: Payer: No Typology Code available for payment source

## 2019-08-27 ENCOUNTER — Other Ambulatory Visit: Payer: Self-pay

## 2019-08-27 DIAGNOSIS — M25511 Pain in right shoulder: Secondary | ICD-10-CM

## 2019-08-27 DIAGNOSIS — G8929 Other chronic pain: Secondary | ICD-10-CM

## 2019-08-27 DIAGNOSIS — R252 Cramp and spasm: Secondary | ICD-10-CM

## 2019-08-28 ENCOUNTER — Ambulatory Visit: Payer: 59 | Admitting: Registered Nurse

## 2019-08-28 ENCOUNTER — Encounter: Payer: Self-pay | Admitting: Registered Nurse

## 2019-08-28 VITALS — BP 141/95 | HR 86 | Temp 98.0°F | Resp 12 | Ht 64.0 in | Wt 179.0 lb

## 2019-08-28 DIAGNOSIS — R030 Elevated blood-pressure reading, without diagnosis of hypertension: Secondary | ICD-10-CM

## 2019-08-28 DIAGNOSIS — M545 Low back pain, unspecified: Secondary | ICD-10-CM | POA: Insufficient documentation

## 2019-08-28 DIAGNOSIS — L249 Irritant contact dermatitis, unspecified cause: Secondary | ICD-10-CM

## 2019-08-28 DIAGNOSIS — S139XXA Sprain of joints and ligaments of unspecified parts of neck, initial encounter: Secondary | ICD-10-CM | POA: Insufficient documentation

## 2019-08-28 MED ORDER — AQUAPHOR EX OINT: TOPICAL_OINTMENT | Freq: Two times a day (BID) | CUTANEOUS | 0 refills | Status: AC | PRN

## 2019-08-28 MED ORDER — HYDROCORTISONE 1 % EX LOTN: 1.0000 "application " | TOPICAL_LOTION | Freq: Two times a day (BID) | CUTANEOUS | 0 refills | Status: AC
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
## 2019-08-28 NOTE — Progress Notes (Addendum)
Subjective:    Patient ID: Mary Curtis, female    DOB: July 25, 1976, 43 y.o.   MRN: [id number]  43y/o caucasian female here for evaluation rash right arm started 4 days ago. States itching about a 2/10. Unsure if her outdoor cat had poison ivy oil on fur and rubbed on her.  She has known poison ivy in her yard.  Cat also had nails out and she was mildly scratched through her clothes.  Bought clothes from [employer] and unsure if something on them triggered her rash.  Denied new hygiene/laundry/cleaning products at home.  Works [employer]or, [employer] part time and city [employer] [place] full time.  Denied current seasonal allergy flare.  See RN note below.     Review of Systems  Constitutional: Negative for activity change, appetite change, chills, diaphoresis, fatigue and fever.  HENT: Negative for facial swelling, trouble swallowing and voice change.   Eyes: Negative for photophobia, pain, discharge, redness, itching and visual disturbance.  Respiratory: Negative for cough, choking, chest tightness, shortness of breath, wheezing and stridor.   Cardiovascular: Negative for chest pain, palpitations and leg swelling.  Gastrointestinal: Negative for diarrhea, nausea and vomiting.  Endocrine: Negative for cold intolerance and heat intolerance.  Genitourinary: Negative for difficulty urinating.  Musculoskeletal: Negative for arthralgias, back pain, gait problem, joint swelling, myalgias, neck pain and neck stiffness.  Skin: Positive for rash. Negative for color change, pallor and wound.  Allergic/Immunologic: Positive for environmental allergies. Negative for food allergies.  Neurological: Negative for dizziness, tremors, seizures, syncope, facial asymmetry, speech difficulty, weakness, light-headedness, numbness and headaches.  Hematological: Negative for adenopathy. Does not bruise/bleed easily.  Psychiatric/Behavioral: Negative for agitation, confusion and sleep disturbance.        Objective:   Physical Exam Vitals signs and nursing note reviewed.  Constitutional:      General: She is awake. She is not in acute distress.    Appearance: Normal appearance. She is well-developed and well-groomed. She is obese. She is not ill-appearing, toxic-appearing or diaphoretic.  HENT:     Head: Normocephalic and atraumatic.     Jaw: There is normal jaw occlusion.     Salivary Glands: Right salivary gland is not diffusely enlarged or tender. Left salivary gland is not diffusely enlarged or tender.     Right Ear: Hearing and external ear normal.     Left Ear: Hearing and external ear normal.     Nose: Nose normal.     Mouth/Throat:     Mouth: Mucous membranes are moist.     Pharynx: Oropharynx is clear. No oropharyngeal exudate or posterior oropharyngeal erythema.  Eyes:     General: Lids are normal. Lids are everted, no foreign bodies appreciated. Vision grossly intact. Gaze aligned appropriately. No allergic shiner, visual field deficit or scleral icterus.       Right eye: No discharge.        Left eye: No discharge.     Extraocular Movements: Extraocular movements intact.     Conjunctiva/sclera: Conjunctivae normal.     Pupils: Pupils are equal, round, and reactive to light.  Neck:     Musculoskeletal: Normal range of motion and neck supple. No neck rigidity or muscular tenderness.     Trachea: Trachea and phonation normal.  Cardiovascular:     Rate and Rhythm: Normal rate and regular rhythm.     Pulses: Normal pulses.          Radial pulses are 2+ on the right side and 2+ on  the left side.     Heart sounds: Normal heart sounds.  Pulmonary:     Effort: Pulmonary effort is normal. No respiratory distress.     Breath sounds: Normal breath sounds and air entry. No stridor, decreased air movement or transmitted upper airway sounds. No decreased breath sounds, wheezing, rhonchi or rales.     Comments: Wearing cloth mask due to covid 19 pandemic; no cough observed in exam room;  spoke full sentences without difficulty Abdominal:     Palpations: Abdomen is soft.  Musculoskeletal: Normal range of motion.        General: No swelling, tenderness, deformity or signs of injury.     Right shoulder: Normal.     Left shoulder: Normal.     Right elbow: Normal.    Left elbow: Normal.     Right hip: Normal.     Left hip: Normal.     Right knee: Normal.     Left knee: Normal.     Right ankle: Normal.     Left ankle: Normal.     Cervical back: Normal.     Thoracic back: Normal.     Lumbar back: Normal.     Right upper arm: Normal.     Left upper arm: Normal.     Right forearm: Normal.     Left forearm: Normal.     Right hand: Normal.     Left hand: Normal.     Right lower leg: No edema.     Left lower leg: No edema.  Lymphadenopathy:     Head:     Right side of head: No submental, submandibular, tonsillar, preauricular, posterior auricular or occipital adenopathy.     Left side of head: No submental, submandibular, tonsillar, preauricular, posterior auricular or occipital adenopathy.     Cervical: No cervical adenopathy.     Right cervical: No superficial cervical adenopathy.    Left cervical: No superficial cervical adenopathy.  Skin:    General: Skin is warm and dry.     Capillary Refill: Capillary refill takes less than 2 seconds.     Coloration: Skin is not ashen, cyanotic, jaundiced, mottled, pale or sallow.     Findings: Abrasion and rash present. No abscess, acne, bruising, burn, ecchymosis, erythema, signs of injury, laceration, lesion, petechiae or wound. Rash is macular and papular. Rash is not crusting, nodular, purpuric, pustular, scaling, urticarial or vesicular.     Nails: There is no clubbing.           Comments: Hypopigmented halo around anterior forearm lesion also patient reported she applied ointment this am  Neurological:     General: No focal deficit present.     Mental Status: She is alert and oriented to person, place, and time. Mental  status is at baseline.     GCS: GCS eye subscore is 4. GCS verbal subscore is 5. GCS motor subscore is 6.     Cranial Nerves: Cranial nerves are intact. No cranial nerve deficit, dysarthria or facial asymmetry.     Sensory: Sensation is intact. No sensory deficit.     Motor: Motor function is intact. No weakness, tremor, atrophy, abnormal muscle tone or seizure activity.     Coordination: Coordination is intact. Coordination normal.     Gait: Gait is intact. Gait normal.     Comments: On/off exam table without difficulty; gait sure and steady in hallway  Psychiatric:        Attention and Perception: Attention and perception normal.  Mood and Affect: Mood and affect normal.        Speech: Speech normal.        Behavior: Behavior normal. Behavior is cooperative.        Thought Content: Thought content normal.        Cognition and Memory: Cognition and memory normal.        Judgment: Judgment normal.           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  Patient refused OTC antihistamine discussed it can help with pruritis.  She will try hydrocortisone 1% lotion apply BID prn.   Wash hands before and after application. Apply emollient over top of hydrocortisone e.g. vaseline/aquaphor/eucerin fragrance free. Avoid hot steamy showers.  Apply emollient twice a day e.g. Fragrance free vaseline, aquaphor or eucerin.  May apply ice/cold compress 5 minutes QID prn itching/swelling.  Shower when she finishes work/prior to bedtime.  Avoid harsh/abrasive soaps use fragrance free/sensitive like dove/cetaphil.    Medication as directed. Call or return to clinic as needed if these symptoms worsen or fail to improve as anticipated. Exitcare handout on contact dermatitis . Continue  to monitor for signs and symptoms of infection e.g. purulent drainage, increased temperature, swelling, and/or pain and notify me if these occur as you may need antibiotic.  Consider laundering towels/bed linens more frequently. Avoid scratching. Follow up for re-evaluation if no improvement and/or worsening of rash with plan of care. Patient verbalized agreement and understanding of treatment plan and had no further questions at this time  Patient refused influenza vaccine  Discussed flu season has started and symptoms of flu similar to covid.  Discussed if she changes her mind patient can walk in on Fridays to clinic for vaccination or schedule appt with RN.  Patient verbalized understanding information and had no further questions at this time.  Patient anxious about rash, itchy and this stress can raise blood pressure.  Itching countermeasures discussed with patient see above.   Discussed ER if chest pain, worst headache of life, dyspnea or visual changes for re-evaluation.

## 2019-08-28 NOTE — Patient Instructions (Signed)
Contact Dermatitis Dermatitis is redness, soreness, and swelling (inflammation) of the skin. Contact dermatitis is a reaction to certain substances that touch the skin. Many different substances can cause contact dermatitis. There are two types of contact dermatitis:  Irritant contact dermatitis. This type is caused by something that irritates your skin, such as having dry hands from washing them too often with soap. This type does not require previous exposure to the substance for a reaction to occur. This is the most common type.  Allergic contact dermatitis. This type is caused by a substance that you are allergic to, such as poison ivy. This type occurs when you have been exposed to the substance (allergen) and develop a sensitivity to it. Dermatitis may develop soon after your first exposure to the allergen, or it may not develop until the next time you are exposed and every time thereafter. What are the causes? Irritant contact dermatitis is most commonly caused by exposure to:  Makeup.  Soaps.  Detergents.  Bleaches.  Acids.  Metal salts, such as nickel. Allergic contact dermatitis is most commonly caused by exposure to:  Poisonous plants.  Chemicals.  Jewelry.  Latex.  Medicines.  Preservatives in products, such as clothing. What increases the risk? You are more likely to develop this condition if you have:  A job that exposes you to irritants or allergens.  Certain medical conditions, such as asthma or eczema. What are the signs or symptoms? Symptoms of this condition may occur on your body anywhere the irritant has touched you or is touched by you.  Symptoms include: ? Dryness or flaking. ? Redness. ? Cracks. ? Itching. ? Pain or a burning feeling. ? Blisters. ? Drainage of small amounts of blood or clear fluid from skin cracks. With allergic contact dermatitis, there may also be swelling in areas such as the eyelids, mouth, or genitals. How is this  diagnosed? This condition is diagnosed with a medical history and physical exam.  A patch skin test may be performed to help determine the cause.  If the condition is related to your job, you may need to see an occupational medicine specialist. How is this treated? This condition is treated by checking for the cause of the reaction and protecting your skin from further contact. Treatment may also include:  Steroid creams or ointments. Oral steroid medicines may be needed in more severe cases.  Antibiotic medicines or antibacterial ointments, if a skin infection is present.  Antihistamine lotion or an antihistamine taken by mouth to ease itching.  A bandage (dressing). Follow these instructions at home: Skin care  Moisturize your skin as needed.  Apply cool compresses to the affected areas.  Try applying baking soda paste to your skin. Stir water into baking soda until it reaches a paste-like consistency.  Do not scratch your skin, and avoid friction to the affected area.  Avoid the use of soaps, perfumes, and dyes. Medicines  Take or apply over-the-counter and prescription medicines only as told by your health care provider.  If you were prescribed an antibiotic medicine, take or apply the antibiotic as told by your health care provider. Do not stop using the antibiotic even if your condition improves. Bathing  Try taking a bath with: ? Epsom salts. Follow the instructions on the packaging. You can get these at your local pharmacy or grocery store. ? Baking soda. Pour a small amount into the bath as directed by your health care provider. ? Colloidal oatmeal. Follow the instructions on the   packaging. You can get this at your local pharmacy or grocery store.  Bathe less frequently, such as every other day.  Bathe in lukewarm water. Avoid using hot water. Bandage care  If you were given a bandage (dressing), change it as told by your health care provider.  Wash your hands  with soap and water before and after you change your dressing. If soap and water are not available, use hand sanitizer. General instructions  Avoid the substance that caused your reaction. If you do not know what caused it, keep a journal to try to track what caused it. Write down: ? What you eat. ? What cosmetic products you use. ? What you drink. ? What you wear in the affected area. This includes jewelry.  More redness, swelling, or pain.  More fluid or blood.  Warmth.  Pus or a bad smell.  Keep all follow-up visits as told by your health care provider. This is important. Contact a health care provider if:  Your condition does not improve with treatment.  Your condition gets worse.  You have signs of infection such as swelling, tenderness, redness, soreness, or warmth in the affected area.  You have a fever.  You have new symptoms. Get help right away if:  You have a severe headache, neck pain, or neck stiffness.  You vomit.  You feel very sleepy.  You notice red streaks coming from the affected area.  Your bone or joint underneath the affected area becomes painful after the skin has healed.  The affected area turns darker.  You have difficulty breathing. Summary  Dermatitis is redness, soreness, and swelling (inflammation) of the skin. Contact dermatitis is a reaction to certain substances that touch the skin.  Symptoms of this condition may occur on your body anywhere the irritant has touched you or is touched by you.  This condition is treated by figuring out what caused the reaction and protecting your skin from further contact. Treatment may also include medicines and skin care.  Avoid the substance that caused your reaction. If you do not know what caused it, keep a journal to try to track what caused it.  Contact a health care provider if your condition gets worse or you have signs of infection such as swelling, tenderness, redness, soreness, or warmth  in the affected area. This information is not intended to replace advice given to you by your health care provider. Make sure you discuss any questions you have with your health care provider. Document Released: 10/28/2000 Document Revised: 02/20/2019 Document Reviewed: 05/16/2018 Elsevier Patient Education  2020 [employer]  

## 2019-08-28 NOTE — Progress Notes (Signed)
Rash on right upper arm - 1st noticed on Sunday.   Denies pain or itching. Tried calamine lotion, antibiotic ointment, Size is about the same.  AMD

## 2019-09-04 ENCOUNTER — Other Ambulatory Visit: Payer: Self-pay

## 2019-09-04 ENCOUNTER — Ambulatory Visit: Payer: No Typology Code available for payment source | Attending: Specialist

## 2019-09-04 DIAGNOSIS — G8929 Other chronic pain: Secondary | ICD-10-CM

## 2019-09-04 DIAGNOSIS — M25511 Pain in right shoulder: Secondary | ICD-10-CM | POA: Diagnosis not present

## 2019-09-04 DIAGNOSIS — R252 Cramp and spasm: Secondary | ICD-10-CM
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
## 2019-09-16 DIAGNOSIS — H5203 Hypermetropia, bilateral: Secondary | ICD-10-CM | POA: Diagnosis not present

## 2020-01-03 ENCOUNTER — Ambulatory Visit: Payer: 59 | Admitting: Certified Nurse Midwife
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
## 2020-01-24 ENCOUNTER — Encounter: Payer: Self-pay | Admitting: Certified Nurse Midwife

## 2020-01-24 ENCOUNTER — Other Ambulatory Visit: Payer: Self-pay

## 2020-01-24 ENCOUNTER — Other Ambulatory Visit (HOSPITAL_COMMUNITY)
Admission: RE | Admit: 2020-01-24 | Discharge: 2020-01-24 | Disposition: A | Discharge status: Home / Self Care | Payer: 59 | Source: Ambulatory Visit | Attending: Certified Nurse Midwife | Admitting: Certified Nurse Midwife

## 2020-01-24 ENCOUNTER — Ambulatory Visit (INDEPENDENT_AMBULATORY_CARE_PROVIDER_SITE_OTHER): Payer: 59 | Admitting: Certified Nurse Midwife

## 2020-01-24 VITALS — BP 136/90 | HR 90 | Temp 97.5°F | Ht 64.0 in | Wt 177.0 lb

## 2020-01-24 DIAGNOSIS — N938 Other specified abnormal uterine and vaginal bleeding: Secondary | ICD-10-CM

## 2020-01-24 DIAGNOSIS — Z01411 Encounter for gynecological examination (general) (routine) with abnormal findings: Secondary | ICD-10-CM

## 2020-01-24 DIAGNOSIS — R030 Elevated blood-pressure reading, without diagnosis of hypertension: Secondary | ICD-10-CM | POA: Diagnosis not present

## 2020-01-24 DIAGNOSIS — Z124 Encounter for screening for malignant neoplasm of cervix: Secondary | ICD-10-CM

## 2020-01-24 DIAGNOSIS — Z01419 Encounter for gynecological examination (general) (routine) without abnormal findings: Secondary | ICD-10-CM

## 2020-01-24 NOTE — Patient Instructions (Addendum)
Blood pressures today were 126/100 and 136/90. Keep a log at home of your blood pressures. Take daily for the next couple weeks. Make an appointment to see the city PA if the majority of your BPs are elevated.   Hypertension, Adult Hypertension is another name for high blood pressure. High blood pressure forces your heart to work harder to pump blood. This can cause problems over time. There are two numbers in a blood pressure reading. There is a top number (systolic) over a bottom number (diastolic). It is best to have a blood pressure that is below 120/80. Healthy choices can help lower your blood pressure, or you may need medicine to help lower it. What are the causes? The cause of this condition is not known. Some conditions may be related to high blood pressure. What increases the risk?  Smoking.  Having type 2 diabetes mellitus, high cholesterol, or both.  Not getting enough exercise or physical activity.  Being overweight.  Having too much fat, sugar, calories, or salt (sodium) in your diet.  Drinking too much alcohol.  Having long-term (chronic) kidney disease.  Having a family history of high blood pressure.  Age. Risk increases with age.  Race. You may be at higher risk if you are African American.  Gender. Men are at higher risk than women before age 16. After age 28, women are at higher risk than men.  Having obstructive sleep apnea.  Stress. What are the signs or symptoms?  High blood pressure may not cause symptoms. Very high blood pressure (hypertensive crisis) may cause: ? Headache. ? Feelings of worry or nervousness (anxiety). ? Shortness of breath. ? Nosebleed. ? A feeling of being sick to your stomach (nausea). ? Throwing up (vomiting). ? Changes in how you see. ? Very bad chest pain. ? Seizures. How is this treated?  This condition is treated by making healthy lifestyle changes, such as: ? Eating healthy foods. ? Exercising more. ? Drinking less  alcohol.  Your health care provider may prescribe medicine if lifestyle changes are not enough to get your blood pressure under control, and if: ? Your top number is above 130. ? Your bottom number is above 80.  Your personal target blood pressure may vary. Follow these instructions at home: Eating and drinking   If told, follow the DASH eating plan. To follow this plan: ? Fill one half of your plate at each meal with fruits and vegetables. ? Fill one fourth of your plate at each meal with whole grains. Whole grains include whole-wheat pasta, brown rice, and whole-grain bread. ? Eat or drink low-fat dairy products, such as skim milk or low-fat yogurt. ? Fill one fourth of your plate at each meal with low-fat (lean) proteins. Low-fat proteins include fish, chicken without skin, eggs, beans, and tofu. ? Avoid fatty meat, cured and processed meat, or chicken with skin. ? Avoid pre-made or processed food.  Eat less than 1,500 mg of salt each day.  Do not drink alcohol if: ? Your doctor tells you not to drink. ? You are pregnant, may be pregnant, or are planning to become pregnant.  If you drink alcohol: ? Limit how much you use to:  0-1 drink a day for women.  0-2 drinks a day for men. ? Be aware of how much alcohol is in your drink. In the [place] one drink equals one 12 oz bottle of beer (355 mL), one 5 oz glass of wine (148 mL), or one 1 oz glass  of hard liquor (44 mL). Lifestyle   Work with your doctor to stay at a healthy weight or to lose weight. Ask your doctor what the best weight is for you.  Get at least 30 minutes of exercise most days of the week. This may include walking, swimming, or biking.  Get at least 30 minutes of exercise that strengthens your muscles (resistance exercise) at least 3 days a week. This may include lifting weights or doing Pilates.  Do not use any products that contain nicotine or tobacco, such as cigarettes, e-cigarettes, and chewing tobacco. If  you need help quitting, ask your doctor.  Check your blood pressure at home as told by your doctor.  Keep all follow-up visits as told by your doctor. This is important. Medicines  Take over-the-counter and prescription medicines only as told by your doctor. Follow directions carefully.  Do not skip doses of blood pressure medicine. The medicine does not work as well if you skip doses. Skipping doses also puts you at risk for problems.  Ask your doctor about side effects or reactions to medicines that you should watch for. Contact a doctor if you:  Think you are having a reaction to the medicine you are taking.  Have headaches that keep coming back (recurring).  Feel dizzy.  Have swelling in your ankles.  Have trouble with your vision. Get help right away if you:  Get a very bad headache.  Start to feel mixed up (confused).  Feel weak or numb.  Feel faint.  Have very bad pain in your: ? Chest. ? Belly (abdomen).  Throw up more than once.  Have trouble breathing. Summary  Hypertension is another name for high blood pressure.  High blood pressure forces your heart to work harder to pump blood.  For most people, a normal blood pressure is less than 120/80.  Making healthy choices can help lower blood pressure. If your blood pressure does not get lower with healthy choices, you may need to take medicine. This information is not intended to replace advice given to you by your health care provider. Make sure you discuss any questions you have with your health care provider. Document Revised: 07/11/2018 Document Reviewed: 07/11/2018 Elsevier Patient Education  2020 [employer].

## 2020-01-28 LAB — CYTOLOGY - PAP
Comment: NEGATIVE
Diagnosis: NEGATIVE
High risk HPV: NEGATIVE

## 2020-04-09 ENCOUNTER — Other Ambulatory Visit: Payer: Self-pay | Admitting: Certified Nurse Midwife

## 2020-04-09 DIAGNOSIS — Z1231 Encounter for screening mammogram for malignant neoplasm of breast: Secondary | ICD-10-CM

## 2020-04-20 NOTE — Progress Notes (Signed)
Scheduled to complete physical on 04/28/20 - no provider available. Needs to reschedule visit with the provider.    Rescheduled for 05/04/20 at 10:45 am  AMD

## 2020-04-21 ENCOUNTER — Ambulatory Visit: Payer: Self-pay

## 2020-04-21 ENCOUNTER — Other Ambulatory Visit: Payer: Self-pay

## 2020-04-21 DIAGNOSIS — Z0000 Encounter for general adult medical examination without abnormal findings: Secondary | ICD-10-CM

## 2020-04-21 LAB — POCT URINALYSIS DIPSTICK
Bilirubin, UA: NEGATIVE
Blood, UA: NEGATIVE
Glucose, UA: NEGATIVE
Ketones, UA: NEGATIVE
Leukocytes, UA: NEGATIVE
Nitrite, UA: NEGATIVE
Protein, UA: NEGATIVE
Spec Grav, UA: 1.01 (ref 1.010–1.025)
Urobilinogen, UA: 0.2 E.U./dL
pH, UA: 6 (ref 5.0–8.0)

## 2020-04-22 LAB — CMP12+LP+TP+TSH+6AC+CBC/D/PLT
ALT: 10 IU/L (ref 0–32)
AST: 15 IU/L (ref 0–40)
Albumin/Globulin Ratio: 1.5 (ref 1.2–2.2)
Albumin: 4.5 g/dL (ref 3.8–4.8)
Alkaline Phosphatase: 86 IU/L (ref 48–121)
BUN/Creatinine Ratio: 9 (ref 9–23)
BUN: 8 mg/dL (ref 6–24)
Basophils Absolute: 0.1 10*3/uL (ref 0.0–0.2)
Basos: 1 %
Bilirubin Total: 0.9 mg/dL (ref 0.0–1.2)
Calcium: 9.4 mg/dL (ref 8.7–10.2)
Chloride: 102 mmol/L (ref 96–106)
Chol/HDL Ratio: 3.4 ratio (ref 0.0–4.4)
Cholesterol, Total: 181 mg/dL (ref 100–199)
Creatinine, Ser: 0.86 mg/dL (ref 0.57–1.00)
EOS (ABSOLUTE): 0.1 10*3/uL (ref 0.0–0.4)
Eos: 2 %
Estimated CHD Risk: 0.5 times avg. (ref 0.0–1.0)
Free Thyroxine Index: 3 (ref 1.2–4.9)
GFR calc Af Amer: 96 mL/min/{1.73_m2} (ref >59)
GFR calc non Af Amer: 83 mL/min/{1.73_m2} (ref >59)
GGT: 12 IU/L (ref 0–60)
Globulin, Total: 3 g/dL (ref 1.5–4.5)
Glucose: 94 mg/dL (ref 65–99)
HDL: 53 mg/dL (ref >39)
Hematocrit: 47.2 % — ABNORMAL HIGH (ref 34.0–46.6)
Hemoglobin: 15.4 g/dL (ref 11.1–15.9)
Immature Grans (Abs): 0 10*3/uL (ref 0.0–0.1)
Immature Granulocytes: 0 %
Iron: 161 ug/dL — ABNORMAL HIGH (ref 27–159)
LDH: 164 IU/L (ref 119–226)
LDL Chol Calc (NIH): 116 mg/dL — ABNORMAL HIGH (ref 0–99)
Lymphocytes Absolute: 2.1 10*3/uL (ref 0.7–3.1)
Lymphs: 31 %
MCH: 28.3 pg (ref 26.6–33.0)
MCHC: 32.6 g/dL (ref 31.5–35.7)
MCV: 87 fL (ref 79–97)
Monocytes Absolute: 0.4 10*3/uL (ref 0.1–0.9)
Monocytes: 5 %
Neutrophils Absolute: 4 10*3/uL (ref 1.4–7.0)
Neutrophils: 61 %
Phosphorus: 2.6 mg/dL — ABNORMAL LOW (ref 3.0–4.3)
Platelets: 324 10*3/uL (ref 150–450)
Potassium: 3.8 mmol/L (ref 3.5–5.2)
RBC: 5.44 x10E6/uL — ABNORMAL HIGH (ref 3.77–5.28)
RDW: 11.8 % (ref 11.7–15.4)
Sodium: 137 mmol/L (ref 134–144)
T3 Uptake Ratio: 32 % (ref 24–39)
T4, Total: 9.4 ug/dL (ref 4.5–12.0)
TSH: 1.43 u[IU]/mL (ref 0.450–4.500)
Total Protein: 7.5 g/dL (ref 6.0–8.5)
Triglycerides: 61 mg/dL (ref 0–149)
Uric Acid: 4.6 mg/dL (ref 2.6–6.2)
VLDL Cholesterol Cal: 12 mg/dL (ref 5–40)
WBC: 6.6 10*3/uL (ref 3.4–10.8)

## 2020-04-23 ENCOUNTER — Ambulatory Visit
Admission: RE | Admit: 2020-04-23 | Discharge: 2020-04-23 | Disposition: A | Discharge status: Home / Self Care | Payer: 59 | Source: Ambulatory Visit | Attending: Certified Nurse Midwife | Admitting: Certified Nurse Midwife

## 2020-04-23 DIAGNOSIS — Z1231 Encounter for screening mammogram for malignant neoplasm of breast: Secondary | ICD-10-CM | POA: Insufficient documentation

## 2020-05-04 ENCOUNTER — Encounter: Payer: Self-pay | Admitting: Emergency Medicine

## 2020-05-04 ENCOUNTER — Ambulatory Visit: Payer: Self-pay | Admitting: Emergency Medicine

## 2020-05-04 ENCOUNTER — Other Ambulatory Visit: Payer: Self-pay

## 2020-05-04 VITALS — BP 138/100 | HR 104 | Temp 97.9°F | Resp 16 | Ht 64.0 in | Wt 176.0 lb

## 2020-05-04 DIAGNOSIS — Z0000 Encounter for general adult medical examination without abnormal findings: Secondary | ICD-10-CM
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
## 2020-08-20 DIAGNOSIS — Z20828 Contact with and (suspected) exposure to other viral communicable diseases: Secondary | ICD-10-CM | POA: Diagnosis not present

## 2020-10-05 DIAGNOSIS — Z20828 Contact with and (suspected) exposure to other viral communicable diseases: Secondary | ICD-10-CM | POA: Diagnosis not present

## 2021-01-11 DIAGNOSIS — Z20828 Contact with and (suspected) exposure to other viral communicable diseases: Secondary | ICD-10-CM | POA: Diagnosis not present

## 2021-02-11 ENCOUNTER — Ambulatory Visit: Payer: Self-pay

## 2021-02-11 ENCOUNTER — Other Ambulatory Visit: Payer: Self-pay

## 2021-02-11 DIAGNOSIS — Z01818 Encounter for other preprocedural examination: Secondary | ICD-10-CM

## 2021-02-11 LAB — POCT URINALYSIS DIPSTICK
Bilirubin, UA: NEGATIVE
Blood, UA: NEGATIVE
Glucose, UA: NEGATIVE
Ketones, UA: NEGATIVE
Leukocytes, UA: NEGATIVE
Nitrite, UA: NEGATIVE
Protein, UA: NEGATIVE
Spec Grav, UA: 1.015 (ref 1.010–1.025)
Urobilinogen, UA: 0.2 E.U./dL
pH, UA: 5.5 (ref 5.0–8.0)

## 2021-02-11 NOTE — Progress Notes (Signed)
Pt scheduled to complete physical 02/18/21 with Debroah Baller, FNP.

## 2021-02-12 LAB — CMP12+LP+TP+TSH+6AC+CBC/D/PLT
ALT: 12 IU/L (ref 0–32)
AST: 16 IU/L (ref 0–40)
Albumin/Globulin Ratio: 1.7 (ref 1.2–2.2)
Albumin: 4.5 g/dL (ref 3.8–4.8)
Alkaline Phosphatase: 80 IU/L (ref 44–121)
BUN/Creatinine Ratio: 11 (ref 9–23)
BUN: 9 mg/dL (ref 6–24)
Basophils Absolute: 0.1 10*3/uL (ref 0.0–0.2)
Basos: 1 %
Bilirubin Total: 0.7 mg/dL (ref 0.0–1.2)
Calcium: 9.2 mg/dL (ref 8.7–10.2)
Chloride: 101 mmol/L (ref 96–106)
Chol/HDL Ratio: 3.5 ratio (ref 0.0–4.4)
Cholesterol, Total: 177 mg/dL (ref 100–199)
Creatinine, Ser: 0.84 mg/dL (ref 0.57–1.00)
EOS (ABSOLUTE): 0.1 10*3/uL (ref 0.0–0.4)
Eos: 2 %
Estimated CHD Risk: 0.6 times avg. (ref 0.0–1.0)
Free Thyroxine Index: 2.6 (ref 1.2–4.9)
GGT: 11 IU/L (ref 0–60)
Globulin, Total: 2.7 g/dL (ref 1.5–4.5)
Glucose: 96 mg/dL (ref 65–99)
HDL: 50 mg/dL (ref >39)
Hematocrit: 43.5 % (ref 34.0–46.6)
Hemoglobin: 14.5 g/dL (ref 11.1–15.9)
Immature Grans (Abs): 0 10*3/uL (ref 0.0–0.1)
Immature Granulocytes: 0 %
Iron: 117 ug/dL (ref 27–159)
LDH: 140 IU/L (ref 119–226)
LDL Chol Calc (NIH): 115 mg/dL — ABNORMAL HIGH (ref 0–99)
Lymphocytes Absolute: 1.6 10*3/uL (ref 0.7–3.1)
Lymphs: 33 %
MCH: 28.8 pg (ref 26.6–33.0)
MCHC: 33.3 g/dL (ref 31.5–35.7)
MCV: 86 fL (ref 79–97)
Monocytes Absolute: 0.3 10*3/uL (ref 0.1–0.9)
Monocytes: 5 %
Neutrophils Absolute: 2.7 10*3/uL (ref 1.4–7.0)
Neutrophils: 59 %
Phosphorus: 2.9 mg/dL — ABNORMAL LOW (ref 3.0–4.3)
Platelets: 299 10*3/uL (ref 150–450)
Potassium: 3.9 mmol/L (ref 3.5–5.2)
RBC: 5.04 x10E6/uL (ref 3.77–5.28)
RDW: 11.9 % (ref 11.7–15.4)
Sodium: 140 mmol/L (ref 134–144)
T3 Uptake Ratio: 30 % (ref 24–39)
T4, Total: 8.5 ug/dL (ref 4.5–12.0)
TSH: 1.53 u[IU]/mL (ref 0.450–4.500)
Total Protein: 7.2 g/dL (ref 6.0–8.5)
Triglycerides: 64 mg/dL (ref 0–149)
Uric Acid: 4.4 mg/dL (ref 2.6–6.2)
VLDL Cholesterol Cal: 12 mg/dL (ref 5–40)
WBC: 4.7 10*3/uL (ref 3.4–10.8)
eGFR: 88 mL/min/{1.73_m2} (ref >59)

## 2021-02-18 ENCOUNTER — Other Ambulatory Visit: Payer: Self-pay

## 2021-02-18 ENCOUNTER — Ambulatory Visit: Payer: Self-pay | Admitting: Physician Assistant

## 2021-02-18 ENCOUNTER — Encounter: Payer: Self-pay | Admitting: Physician Assistant

## 2021-02-18 VITALS — BP 122/82 | HR 88 | Temp 98.2°F | Resp 12 | Ht 64.0 in | Wt 171.0 lb

## 2021-02-18 DIAGNOSIS — Z1231 Encounter for screening mammogram for malignant neoplasm of breast: Secondary | ICD-10-CM

## 2021-02-18 DIAGNOSIS — Z0000 Encounter for general adult medical examination without abnormal findings: Secondary | ICD-10-CM

## 2021-02-18 NOTE — Progress Notes (Signed)
   Subjective: Physical exam    Patient ID: Mary Curtis, female    DOB: 10/15/1976, 45 y.o.   MRN: [id number]  HPI Patient presents for physical exam was concerned secondary to a palpable lesion left lower leg.   Review of Systems    Seasonal rhinitis Objective:   Physical Exam No acute distress.  BP is 122/82, pulse 88, respiration 12, temperature 98.2, patient is 90% O2 sat on room air.  BMI is 29.35. HEENT is unremarkable.  Neck is supple adenectomy or bruits.  Lungs are clear to auscultation.  Heart regular rate and rhythm.  No acute findings on EKG.  Abdomen is negative HSM, normoactive bowel sounds, soft, and nontender to palpation.  No obvious deformity to the upper or lower extremities.  Patient has full and equal range of motion to the upper or lower extremities.  No obvious cervical or lumbar spine deformity.  Patient has full and equal range of motion of the cervical lumbar spine.  Cranial nerves II through XII are grossly intact.  Patient has a papular lesion on erythematous base of the left lower leg.       Assessment & Plan: Well exam.  Discussed no acute findings on lab results.  Patient advised follow-up dermatology to address concerns of papular lesion has been on her lower leg for 3 months.

## 2021-03-10 DIAGNOSIS — D2371 Other benign neoplasm of skin of right lower limb, including hip: Secondary | ICD-10-CM | POA: Diagnosis not present

## 2021-03-10 DIAGNOSIS — D2372 Other benign neoplasm of skin of left lower limb, including hip: Secondary | ICD-10-CM | POA: Diagnosis not present

## 2021-03-10 DIAGNOSIS — L814 Other melanin hyperpigmentation: Secondary | ICD-10-CM | POA: Diagnosis not present

## 2021-03-10 DIAGNOSIS — S80261A Insect bite (nonvenomous), right knee, initial encounter: Secondary | ICD-10-CM | POA: Diagnosis not present

## 2021-03-26 NOTE — Addendum Note (Signed)
Addended by: Aliene Altes on: 03/26/2021 10:15 AM   Modules accepted: Orders

## 2021-03-30 ENCOUNTER — Encounter: Payer: Self-pay | Admitting: Obstetrics and Gynecology

## 2021-03-30 ENCOUNTER — Other Ambulatory Visit: Payer: Self-pay

## 2021-03-30 ENCOUNTER — Ambulatory Visit (INDEPENDENT_AMBULATORY_CARE_PROVIDER_SITE_OTHER): Payer: 59 | Admitting: Obstetrics and Gynecology

## 2021-03-30 VITALS — BP 140/80 | Ht 64.0 in | Wt 170.0 lb

## 2021-03-30 DIAGNOSIS — Z01419 Encounter for gynecological examination (general) (routine) without abnormal findings: Secondary | ICD-10-CM | POA: Diagnosis not present

## 2021-03-30 DIAGNOSIS — Z1231 Encounter for screening mammogram for malignant neoplasm of breast: Secondary | ICD-10-CM | POA: Diagnosis not present

## 2021-03-30 NOTE — Patient Instructions (Signed)
I value your feedback and you entrusting us with your care. If you get a [facility] patient survey, I would appreciate you taking the time to let us know about your experience today. Thank you! ? ? ?
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
## 2021-04-06 ENCOUNTER — Ambulatory Visit
Admission: RE | Admit: 2021-04-06 | Discharge: 2021-04-06 | Disposition: A | Discharge status: Home / Self Care | Payer: 59 | Source: Ambulatory Visit | Attending: Physician Assistant | Admitting: Physician Assistant

## 2021-04-06 ENCOUNTER — Other Ambulatory Visit: Payer: Self-pay

## 2021-04-06 DIAGNOSIS — Z1231 Encounter for screening mammogram for malignant neoplasm of breast: Secondary | ICD-10-CM | POA: Insufficient documentation

## 2021-04-19 DIAGNOSIS — H5203 Hypermetropia, bilateral: Secondary | ICD-10-CM | POA: Diagnosis not present

## 2021-12-28 ENCOUNTER — Other Ambulatory Visit: Payer: Self-pay | Admitting: General Surgery

## 2021-12-28 DIAGNOSIS — Z86010 Personal history of colonic polyps: Secondary | ICD-10-CM | POA: Diagnosis not present

## 2021-12-28 DIAGNOSIS — Z800 Family history of malignant neoplasm of digestive organs: Secondary | ICD-10-CM | POA: Diagnosis not present

## 2021-12-28 NOTE — Progress Notes (Addendum)
Subjective:     Patient ID: Mary Curtis is a 46 y.o. female.   HPI   The following portions of the patient's history were reviewed and updated as appropriate.   This a new patient is here today for: office visit. Here to discuss having a colonoscopy, last completed in 2018 by Dr. Gustavo Lah. She denies any GI issues. Patient reports bowel movements daily. She denies any mucus or rectal bleeding.         Chief Complaint  Patient presents with   Pre-op Exam      BP (!) 138/90    Pulse 90    Temp 37.1 C (98.7 F)    Ht 162.6 cm (5\' 4" )    Wt 78.5 kg (173 lb)    LMP 12/02/2021    SpO2 98%    BMI 29.70 kg/m        Past Medical History:  Diagnosis Date   Arthritis 2014   DDD (degenerative disc disease), cervical     Redundant colon 04/05/2017   Tubular adenoma of colon 04/05/2017           Past Surgical History:  Procedure Laterality Date   COLONOSCOPY   04/05/2017    Tubular adenoma of colon/Redundant colon/Repeat 28yrs/MUS   None                    OB History     Gravida  2   Para  2   Term      Preterm      AB      Living         SAB      IAB      Ectopic      Molar      Multiple      Live Births           Obstetric Comments  Age at first period 62 Age of first pregnancy 89             Social History           Socioeconomic History   Marital status: Married  Tobacco Use   Smoking status: Never   Smokeless tobacco: Never  Substance and Sexual Activity   Alcohol use: No      Alcohol/week: 0.0 standard drinks   Drug use: No   Sexual activity: Yes             Allergies  Allergen Reactions   Erythromycin Other (See Comments)      Gi issues      Current Medications        Current Outpatient Medications  Medication Sig Dispense Refill   peg-electrolyte (NULYTELY) solution Take 4,000 mLs by mouth as directed. (Patient not taking: Reported on 12/28/2021) 4000 mL 0    No current facility-administered medications for this  visit.             Family History  Problem Relation Age of Onset   High blood pressure (Hypertension) Mother     Other Mother          DDD   Osteoporosis (Thinning of bones) Mother     Colon cancer Father 25   Other Father          DDD   Cancer Father     Bladder Cancer Father 30   No Known Problems Sister     High blood pressure (Hypertension) Maternal Grandmother     High blood pressure (Hypertension) Maternal  Grandfather     Liver disease Paternal Grandmother     Thyroid cancer Paternal Grandfather 59   Aneurysm Paternal Grandfather 82        brain   Brain hemorrhage Paternal Grandfather     No Known Problems Daughter     No Known Problems Son     Thyroid cancer Paternal Aunt 44   Breast cancer Neg Hx          Labs and Radiology:      Apr 05, 2017 colonoscopy and pathology report reviewed:   Single small tubular adenoma of the ascending colon.   Tortuous colon requiring patient being placed prone.           Review of Systems  Respiratory: Negative.   Cardiovascular: Negative.   Gastrointestinal: Negative.          Objective:   Physical Exam HENT:     Head: Normocephalic.  Eyes:     Pupils: Pupils are equal, round, and reactive to light.  Pulmonary: Clear BS bilaterally.  Cardiovascular:     Rate and Rhythm: Normal rate and regular rhythm.     Pulses: Normal pulses.     Heart sounds: Normal heart sounds.  Musculoskeletal:     Cervical back: Normal range of motion and neck supple.  Skin:    General: Skin is warm and dry.  Neurological:     General: No focal deficit present.     Mental Status: She is alert and oriented to person, place, and time.          Assessment:     Candidate for repeat colonoscopy based on family history.    Plan:     The patient was instructed in regards to preparation by the staff.    This note is partially prepared by Ledell Noss, CMA acting as a scribe in the presence of Dr. Hervey Ard, MD.    The  documentation recorded by the scribe accurately reflects the service I personally performed and the decisions made by me.    Robert Bellow, MD FACS

## 2022-01-25 ENCOUNTER — Encounter: Payer: Self-pay | Admitting: General Surgery

## 2022-01-26 ENCOUNTER — Ambulatory Visit: Payer: 59 | Admitting: Certified Registered Nurse Anesthetist

## 2022-01-26 ENCOUNTER — Ambulatory Visit
Admission: RE | Admit: 2022-01-26 | Discharge: 2022-01-26 | Disposition: A | Discharge status: Home / Self Care | Payer: 59 | Attending: General Surgery | Admitting: General Surgery

## 2022-01-26 ENCOUNTER — Encounter
Admission: RE | Disposition: A | Discharge status: Home / Self Care | Payer: Self-pay | Source: Home / Self Care | Attending: General Surgery

## 2022-01-26 ENCOUNTER — Other Ambulatory Visit: Payer: Self-pay

## 2022-01-26 ENCOUNTER — Encounter: Payer: Self-pay | Admitting: General Surgery

## 2022-01-26 DIAGNOSIS — Z800 Family history of malignant neoplasm of digestive organs: Secondary | ICD-10-CM | POA: Diagnosis not present

## 2022-01-26 DIAGNOSIS — Z09 Encounter for follow-up examination after completed treatment for conditions other than malignant neoplasm: Secondary | ICD-10-CM | POA: Insufficient documentation

## 2022-01-26 DIAGNOSIS — Z1211 Encounter for screening for malignant neoplasm of colon: Secondary | ICD-10-CM | POA: Diagnosis not present

## 2022-01-26 DIAGNOSIS — Z86010 Personal history of colonic polyps: Secondary | ICD-10-CM | POA: Insufficient documentation

## 2022-01-26 HISTORY — PX: COLONOSCOPY WITH PROPOFOL: SHX5780

## 2022-01-26 LAB — POCT PREGNANCY, URINE: Preg Test, Ur: NEGATIVE

## 2022-01-26 SURGERY — COLONOSCOPY WITH PROPOFOL
Anesthesia: General

## 2022-01-26 MED ORDER — PHENYLEPHRINE HCL (PRESSORS) 10 MG/ML IV SOLN
INTRAVENOUS | Status: DC | PRN
  Administered 2022-01-26: 160 ug via INTRAVENOUS
  Administered 2022-01-26 (×2): 80 ug via INTRAVENOUS

## 2022-01-26 MED ORDER — PROPOFOL 10 MG/ML IV BOLUS
INTRAVENOUS | Status: DC | PRN
  Administered 2022-01-26: 80 mg via INTRAVENOUS
  Administered 2022-01-26: 10 mg via INTRAVENOUS

## 2022-01-26 MED ORDER — SODIUM CHLORIDE 0.9 % IV SOLN: INTRAVENOUS | Status: DC

## 2022-01-26 MED ORDER — PROPOFOL 500 MG/50ML IV EMUL
INTRAVENOUS | Status: DC | PRN
  Administered 2022-01-26: 160 ug/kg/min via INTRAVENOUS

## 2022-01-26 NOTE — Anesthesia Postprocedure Evaluation (Signed)
Anesthesia Post Note ? ?Patient: Mary Curtis ? ?Procedure(s) Performed: COLONOSCOPY WITH PROPOFOL ? ?Patient location during evaluation: Endoscopy ?Anesthesia Type: General ?Level of consciousness: awake and alert ?Pain management: pain level controlled ?Vital Signs Assessment: post-procedure vital signs reviewed and stable ?Respiratory status: spontaneous breathing, nonlabored ventilation, respiratory function stable and patient connected to nasal cannula oxygen ?Cardiovascular status: blood pressure returned to baseline and stable ?Postop Assessment: no apparent nausea or vomiting ?Anesthetic complications: no ? ? ?No notable events documented. ? ? ?Last Vitals:  ?Vitals:  ? 01/26/22 0915 01/26/22 1008  ?BP: (!) 127/93 99/61  ?Pulse: 82 78  ?Resp:    ?Temp:  36.6 ?C  ?SpO2: 100% 94%  ?  ?Last Pain:  ?Vitals:  ? 01/26/22 1028  ?TempSrc:   ?PainSc: 0-No pain  ? ? ?  ?  ?  ?  ?  ?  ? ?Arita Miss ? ? ? ? ?
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
## 2022-01-26 NOTE — Op Note (Signed)
[facility] ?Gastroenterology ?Patient Name: Mary Curtis ?Procedure Date: 01/26/2022 9:29 AM ?MRN: [id number] ?Account #: [id number] ?Date of Birth: [birth year] ?Admit Type: Outpatient ?Age: 46 ?Room: [facility] ENDO ROOM 3 ?Gender: Female ?Note Status: Finalized ?Instrument Name: Peds Colonoscope [id number] ?Procedure:             Colonoscopy ?Indications:           High risk colon cancer surveillance: Personal history  ?                       of colonic polyps, Family history of colon cancer in a  ?                       first-degree relative before age 70 years ?Providers:             Robert Bellow, MD ?Referring MD:          Forest Gleason Md, MD (Referring MD) ?Medicines:             Propofol per Anesthesia ?Complications:         No immediate complications. ?Procedure:             Pre-Anesthesia Assessment: ?                       - Prior to the procedure, a History and Physical was  ?                       performed, and patient medications, allergies and  ?                       sensitivities were reviewed. The patient's tolerance  ?                       of previous anesthesia was reviewed. ?                       - The risks and benefits of the procedure and the  ?                       sedation options and risks were discussed with the  ?                       patient. All questions were answered and informed  ?                       consent was obtained. ?                       After obtaining informed consent, the colonoscope was  ?                       passed under direct vision. Throughout the procedure,  ?                       the patient's blood pressure, pulse, and oxygen  ?                       saturations were monitored continuously. The  ?  Colonoscope was introduced through the anus and  ?                       advanced to the the cecum, identified by appendiceal  ?                       orifice and ileocecal valve. The colonoscopy was  ?                        performed without difficulty. The patient tolerated  ?                       the procedure well. The quality of the bowel  ?                       preparation was excellent. ?Findings: ?     The entire examined colon appeared normal on direct and retroflexion  ?     views. ?     A small left posterior/inferior labial ulcer was identified (<5 mm).  ?     Likely related to trauma. ?Impression:            - The entire examined colon is normal on direct and  ?                       retroflexion views. ?                       - No specimens collected. ?Recommendation:        - Repeat colonoscopy in 5 years for surveillance. ?Procedure Code(s):     --- Professional --- ?                       [phone], Colonoscopy, flexible; diagnostic, including  ?                       collection of specimen(s) by brushing or washing, when  ?                       performed (separate procedure) ?Diagnosis Code(s):     --- Professional --- ?                       Z86.010, Personal history of colonic polyps ?                       Z80.0, Family history of malignant neoplasm of  ?                       digestive organs ?CPT copyright 2019 [employer]. All rights reserved. ?The codes documented in this report are preliminary and upon coder review may  ?be revised to meet current compliance requirements. ?Robert Bellow, MD ?01/26/2022 10:08:30 AM ?This report has been signed electronically. ?Number of Addenda: 0 ?Note Initiated On: 01/26/2022 9:29 AM ?Scope Withdrawal Time: 0 hours 10 minutes 42 seconds  ?Total Procedure Duration: 0 hours 18 minutes 14 seconds  ?Estimated Blood Loss:  Estimated blood loss: none. ?     [facility] ?

## 2022-01-26 NOTE — Transfer of Care (Signed)
Immediate Anesthesia Transfer of Care Note ? ?Patient: Mary Curtis ? ?Procedure(s) Performed: COLONOSCOPY WITH PROPOFOL ? ?Patient Location: PACU ? ?Anesthesia Type:General ? ?Level of Consciousness: drowsy ? ?Airway & Oxygen Therapy: Patient Spontanous Breathing ? ?Post-op Assessment: Report given to RN and Post -op Vital signs reviewed and stable ? ?Post vital signs: Reviewed and stable ? ?Last Vitals:  ?Vitals Value Taken Time  ?BP 99/61 01/26/22 1009  ?Temp 36.6 ?C 01/26/22 1008  ?Pulse 80 01/26/22 1010  ?Resp 16 01/26/22 1010  ?SpO2 94 % 01/26/22 1010  ?Vitals shown include unvalidated device data. ? ?Last Pain:  ?Vitals:  ? 01/26/22 1008  ?TempSrc: Temporal  ?PainSc: Asleep  ?   ? ?  ? ?Complications: No notable events documented. ?

## 2022-01-26 NOTE — Anesthesia Procedure Notes (Signed)
Date/Time: 01/26/2022 9:46 AM ?Performed by: Demetrius Charity, CRNA ?Pre-anesthesia Checklist: Patient identified, Emergency Drugs available, Suction available, Patient being monitored and Timeout performed ?Patient Re-evaluated:Patient Re-evaluated prior to induction ?Oxygen Delivery Method: Nasal cannula ?Induction Type: IV induction ?Placement Confirmation: CO2 detector and positive ETCO2 ? ? ? ? ?

## 2022-01-28 ENCOUNTER — Encounter: Payer: Self-pay | Admitting: General Surgery

## 2022-02-14 ENCOUNTER — Other Ambulatory Visit: Payer: Self-pay | Admitting: Physician Assistant

## 2022-02-14 DIAGNOSIS — Z1231 Encounter for screening mammogram for malignant neoplasm of breast: Secondary | ICD-10-CM

## 2022-03-02 DIAGNOSIS — G4486 Cervicogenic headache: Secondary | ICD-10-CM | POA: Diagnosis not present

## 2022-03-02 DIAGNOSIS — M9901 Segmental and somatic dysfunction of cervical region: Secondary | ICD-10-CM | POA: Diagnosis not present

## 2022-03-02 DIAGNOSIS — M9902 Segmental and somatic dysfunction of thoracic region: Secondary | ICD-10-CM | POA: Diagnosis not present

## 2022-03-02 DIAGNOSIS — M542 Cervicalgia: Secondary | ICD-10-CM | POA: Diagnosis not present

## 2022-03-03 DIAGNOSIS — M9901 Segmental and somatic dysfunction of cervical region: Secondary | ICD-10-CM | POA: Diagnosis not present

## 2022-03-03 DIAGNOSIS — M542 Cervicalgia: Secondary | ICD-10-CM | POA: Diagnosis not present

## 2022-03-03 DIAGNOSIS — M9902 Segmental and somatic dysfunction of thoracic region: Secondary | ICD-10-CM | POA: Diagnosis not present

## 2022-03-03 DIAGNOSIS — G4486 Cervicogenic headache: Secondary | ICD-10-CM | POA: Diagnosis not present

## 2022-03-08 DIAGNOSIS — M9901 Segmental and somatic dysfunction of cervical region: Secondary | ICD-10-CM | POA: Diagnosis not present

## 2022-03-08 DIAGNOSIS — M9902 Segmental and somatic dysfunction of thoracic region: Secondary | ICD-10-CM | POA: Diagnosis not present

## 2022-03-08 DIAGNOSIS — G4486 Cervicogenic headache: Secondary | ICD-10-CM | POA: Diagnosis not present

## 2022-03-08 DIAGNOSIS — M542 Cervicalgia: Secondary | ICD-10-CM | POA: Diagnosis not present

## 2022-03-10 DIAGNOSIS — M9901 Segmental and somatic dysfunction of cervical region: Secondary | ICD-10-CM | POA: Diagnosis not present

## 2022-03-10 DIAGNOSIS — M9902 Segmental and somatic dysfunction of thoracic region: Secondary | ICD-10-CM | POA: Diagnosis not present

## 2022-03-10 DIAGNOSIS — G4486 Cervicogenic headache: Secondary | ICD-10-CM | POA: Diagnosis not present

## 2022-03-10 DIAGNOSIS — D225 Melanocytic nevi of trunk: Secondary | ICD-10-CM | POA: Diagnosis not present

## 2022-03-10 DIAGNOSIS — L814 Other melanin hyperpigmentation: Secondary | ICD-10-CM | POA: Diagnosis not present

## 2022-03-10 DIAGNOSIS — D2262 Melanocytic nevi of left upper limb, including shoulder: Secondary | ICD-10-CM | POA: Diagnosis not present

## 2022-03-10 DIAGNOSIS — D2261 Melanocytic nevi of right upper limb, including shoulder: Secondary | ICD-10-CM | POA: Diagnosis not present

## 2022-03-10 DIAGNOSIS — M542 Cervicalgia: Secondary | ICD-10-CM | POA: Diagnosis not present

## 2022-03-14 DIAGNOSIS — G4486 Cervicogenic headache: Secondary | ICD-10-CM | POA: Diagnosis not present

## 2022-03-14 DIAGNOSIS — M9901 Segmental and somatic dysfunction of cervical region: Secondary | ICD-10-CM | POA: Diagnosis not present

## 2022-03-14 DIAGNOSIS — M542 Cervicalgia: Secondary | ICD-10-CM | POA: Diagnosis not present

## 2022-03-14 DIAGNOSIS — M9902 Segmental and somatic dysfunction of thoracic region: Secondary | ICD-10-CM | POA: Diagnosis not present

## 2022-03-16 DIAGNOSIS — M9902 Segmental and somatic dysfunction of thoracic region: Secondary | ICD-10-CM | POA: Diagnosis not present

## 2022-03-16 DIAGNOSIS — M9901 Segmental and somatic dysfunction of cervical region: Secondary | ICD-10-CM | POA: Diagnosis not present

## 2022-03-16 DIAGNOSIS — M542 Cervicalgia: Secondary | ICD-10-CM | POA: Diagnosis not present

## 2022-03-16 DIAGNOSIS — G4486 Cervicogenic headache: Secondary | ICD-10-CM | POA: Diagnosis not present

## 2022-03-22 DIAGNOSIS — M542 Cervicalgia: Secondary | ICD-10-CM | POA: Diagnosis not present

## 2022-03-22 DIAGNOSIS — G4486 Cervicogenic headache: Secondary | ICD-10-CM | POA: Diagnosis not present

## 2022-03-22 DIAGNOSIS — M9901 Segmental and somatic dysfunction of cervical region: Secondary | ICD-10-CM | POA: Diagnosis not present

## 2022-03-22 DIAGNOSIS — M9902 Segmental and somatic dysfunction of thoracic region: Secondary | ICD-10-CM | POA: Diagnosis not present

## 2022-03-24 DIAGNOSIS — M9901 Segmental and somatic dysfunction of cervical region: Secondary | ICD-10-CM | POA: Diagnosis not present

## 2022-03-24 DIAGNOSIS — M542 Cervicalgia: Secondary | ICD-10-CM | POA: Diagnosis not present

## 2022-03-24 DIAGNOSIS — G4486 Cervicogenic headache: Secondary | ICD-10-CM | POA: Diagnosis not present

## 2022-03-24 DIAGNOSIS — M9902 Segmental and somatic dysfunction of thoracic region: Secondary | ICD-10-CM | POA: Diagnosis not present

## 2022-03-28 DIAGNOSIS — M9901 Segmental and somatic dysfunction of cervical region: Secondary | ICD-10-CM | POA: Diagnosis not present

## 2022-03-28 DIAGNOSIS — M542 Cervicalgia: Secondary | ICD-10-CM | POA: Diagnosis not present

## 2022-03-28 DIAGNOSIS — G4486 Cervicogenic headache: Secondary | ICD-10-CM | POA: Diagnosis not present

## 2022-03-28 DIAGNOSIS — M9902 Segmental and somatic dysfunction of thoracic region: Secondary | ICD-10-CM | POA: Diagnosis not present

## 2022-03-30 DIAGNOSIS — M9902 Segmental and somatic dysfunction of thoracic region: Secondary | ICD-10-CM | POA: Diagnosis not present

## 2022-03-30 DIAGNOSIS — G4486 Cervicogenic headache: Secondary | ICD-10-CM | POA: Diagnosis not present

## 2022-03-30 DIAGNOSIS — M542 Cervicalgia: Secondary | ICD-10-CM | POA: Diagnosis not present

## 2022-03-30 DIAGNOSIS — M9901 Segmental and somatic dysfunction of cervical region: Secondary | ICD-10-CM | POA: Diagnosis not present

## 2022-03-31 ENCOUNTER — Ambulatory Visit (INDEPENDENT_AMBULATORY_CARE_PROVIDER_SITE_OTHER): Payer: 59 | Admitting: Obstetrics and Gynecology

## 2022-03-31 ENCOUNTER — Encounter: Payer: Self-pay | Admitting: Obstetrics and Gynecology

## 2022-03-31 ENCOUNTER — Ambulatory Visit
Admission: RE | Admit: 2022-03-31 | Discharge: 2022-03-31 | Disposition: A | Discharge status: Home / Self Care | Payer: 59 | Source: Ambulatory Visit | Attending: Physician Assistant | Admitting: Physician Assistant

## 2022-03-31 VITALS — BP 140/72 | Ht 64.0 in | Wt 172.0 lb

## 2022-03-31 DIAGNOSIS — Z1231 Encounter for screening mammogram for malignant neoplasm of breast: Secondary | ICD-10-CM | POA: Diagnosis not present

## 2022-03-31 DIAGNOSIS — Z01419 Encounter for gynecological examination (general) (routine) without abnormal findings: Secondary | ICD-10-CM | POA: Diagnosis not present

## 2022-03-31 DIAGNOSIS — N898 Other specified noninflammatory disorders of vagina: Secondary | ICD-10-CM | POA: Diagnosis not present
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
## 2022-04-12 ENCOUNTER — Ambulatory Visit: Payer: Self-pay

## 2022-04-12 DIAGNOSIS — Z0000 Encounter for general adult medical examination without abnormal findings: Secondary | ICD-10-CM

## 2022-04-12 LAB — POCT URINALYSIS DIPSTICK
Bilirubin, UA: NEGATIVE
Blood, UA: NEGATIVE
Glucose, UA: NEGATIVE
Ketones, UA: NEGATIVE
Leukocytes, UA: NEGATIVE
Nitrite, UA: NEGATIVE
Protein, UA: NEGATIVE
Spec Grav, UA: 1.02 (ref 1.010–1.025)
Urobilinogen, UA: 0.2 E.U./dL
pH, UA: 6 (ref 5.0–8.0)

## 2022-04-12 NOTE — Progress Notes (Signed)
Pt presents today for physical labs, will return to clinic for scheduled physical.  

## 2022-04-13 LAB — CMP12+LP+TP+TSH+6AC+CBC/D/PLT
ALT: 9 IU/L (ref 0–32)
AST: 14 IU/L (ref 0–40)
Albumin/Globulin Ratio: 1.9 (ref 1.2–2.2)
Albumin: 4.5 g/dL (ref 3.8–4.8)
Alkaline Phosphatase: 76 IU/L (ref 44–121)
BUN/Creatinine Ratio: 9 (ref 9–23)
BUN: 7 mg/dL (ref 6–24)
Basophils Absolute: 0 10*3/uL (ref 0.0–0.2)
Basos: 1 %
Bilirubin Total: 0.6 mg/dL (ref 0.0–1.2)
Calcium: 9.5 mg/dL (ref 8.7–10.2)
Chloride: 100 mmol/L (ref 96–106)
Chol/HDL Ratio: 3.4 ratio (ref 0.0–4.4)
Cholesterol, Total: 185 mg/dL (ref 100–199)
Creatinine, Ser: 0.78 mg/dL (ref 0.57–1.00)
EOS (ABSOLUTE): 0.1 10*3/uL (ref 0.0–0.4)
Eos: 2 %
Estimated CHD Risk: <0.5 times avg. (ref 0.0–1.0)
Free Thyroxine Index: 2.6 (ref 1.2–4.9)
GGT: 11 IU/L (ref 0–60)
Globulin, Total: 2.4 g/dL (ref 1.5–4.5)
Glucose: 92 mg/dL (ref 70–99)
HDL: 55 mg/dL (ref >39)
Hematocrit: 44.2 % (ref 34.0–46.6)
Hemoglobin: 14.9 g/dL (ref 11.1–15.9)
Immature Grans (Abs): 0 10*3/uL (ref 0.0–0.1)
Immature Granulocytes: 0 %
Iron: 127 ug/dL (ref 27–159)
LDH: 143 IU/L (ref 119–226)
LDL Chol Calc (NIH): 117 mg/dL — ABNORMAL HIGH (ref 0–99)
Lymphocytes Absolute: 1.9 10*3/uL (ref 0.7–3.1)
Lymphs: 30 %
MCH: 29.2 pg (ref 26.6–33.0)
MCHC: 33.7 g/dL (ref 31.5–35.7)
MCV: 87 fL (ref 79–97)
Monocytes Absolute: 0.3 10*3/uL (ref 0.1–0.9)
Monocytes: 5 %
Neutrophils Absolute: 4.2 10*3/uL (ref 1.4–7.0)
Neutrophils: 62 %
Phosphorus: 3.2 mg/dL (ref 3.0–4.3)
Platelets: 310 10*3/uL (ref 150–450)
Potassium: 3.5 mmol/L (ref 3.5–5.2)
RBC: 5.1 x10E6/uL (ref 3.77–5.28)
RDW: 11.7 % (ref 11.7–15.4)
Sodium: 139 mmol/L (ref 134–144)
T3 Uptake Ratio: 28 % (ref 24–39)
T4, Total: 9.4 ug/dL (ref 4.5–12.0)
TSH: 2.67 u[IU]/mL (ref 0.450–4.500)
Total Protein: 6.9 g/dL (ref 6.0–8.5)
Triglycerides: 67 mg/dL (ref 0–149)
Uric Acid: 4.7 mg/dL (ref 2.6–6.2)
VLDL Cholesterol Cal: 13 mg/dL (ref 5–40)
WBC: 6.6 10*3/uL (ref 3.4–10.8)
eGFR: 95 mL/min/{1.73_m2} (ref >59)

## 2022-04-19 ENCOUNTER — Ambulatory Visit: Payer: Self-pay | Admitting: Physician Assistant

## 2022-04-19 ENCOUNTER — Encounter: Payer: Self-pay | Admitting: Physician Assistant

## 2022-04-19 VITALS — BP 160/90 | HR 90 | Temp 98.0°F | Resp 12 | Ht 64.0 in | Wt 171.0 lb

## 2022-04-19 DIAGNOSIS — R030 Elevated blood-pressure reading, without diagnosis of hypertension: Secondary | ICD-10-CM

## 2022-04-19 DIAGNOSIS — Z0000 Encounter for general adult medical examination without abnormal findings: Secondary | ICD-10-CM
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
## 2022-10-24 ENCOUNTER — Other Ambulatory Visit: Payer: Self-pay | Admitting: Physician Assistant

## 2022-10-24 MED ORDER — GENTAMICIN SULFATE 0.3 % OP SOLN: 1.0000 [drp] | OPHTHALMIC | 0 refills | Status: DC

## 2022-10-27 ENCOUNTER — Other Ambulatory Visit: Payer: Self-pay

## 2022-10-27 DIAGNOSIS — H00019 Hordeolum externum unspecified eye, unspecified eyelid: Secondary | ICD-10-CM

## 2022-10-27 MED ORDER — GENTAMICIN SULFATE 0.1 % EX OINT: 1.0000 | TOPICAL_OINTMENT | Freq: Three times a day (TID) | CUTANEOUS | 0 refills | Status: DC

## 2022-11-02 DIAGNOSIS — H01135 Eczematous dermatitis of left lower eyelid: Secondary | ICD-10-CM | POA: Diagnosis not present

## 2022-11-02 DIAGNOSIS — H01132 Eczematous dermatitis of right lower eyelid: Secondary | ICD-10-CM | POA: Diagnosis not present

## 2023-01-31 ENCOUNTER — Telehealth: Payer: Self-pay

## 2023-01-31 DIAGNOSIS — Z1231 Encounter for screening mammogram for malignant neoplasm of breast: Secondary | ICD-10-CM

## 2023-01-31 NOTE — Telephone Encounter (Signed)
Mary Curtis called [facility] requesting an order for her annual mammogram.  AMD

## 2023-02-24 ENCOUNTER — Other Ambulatory Visit: Payer: Self-pay

## 2023-03-15 DIAGNOSIS — D2272 Melanocytic nevi of left lower limb, including hip: Secondary | ICD-10-CM | POA: Diagnosis not present

## 2023-03-15 DIAGNOSIS — L0202 Furuncle of face: Secondary | ICD-10-CM | POA: Diagnosis not present

## 2023-03-15 DIAGNOSIS — L02821 Furuncle of head [any part, except face]: Secondary | ICD-10-CM | POA: Diagnosis not present

## 2023-03-15 DIAGNOSIS — H01132 Eczematous dermatitis of right lower eyelid: Secondary | ICD-10-CM | POA: Diagnosis not present

## 2023-03-15 DIAGNOSIS — H01139 Eczematous dermatitis of unspecified eye, unspecified eyelid: Secondary | ICD-10-CM | POA: Diagnosis not present

## 2023-03-15 DIAGNOSIS — D225 Melanocytic nevi of trunk: Secondary | ICD-10-CM | POA: Diagnosis not present

## 2023-03-15 DIAGNOSIS — D2262 Melanocytic nevi of left upper limb, including shoulder: Secondary | ICD-10-CM | POA: Diagnosis not present

## 2023-03-15 DIAGNOSIS — D2261 Melanocytic nevi of right upper limb, including shoulder: Secondary | ICD-10-CM | POA: Diagnosis not present

## 2023-03-17 ENCOUNTER — Other Ambulatory Visit: Payer: Self-pay

## 2023-03-17 DIAGNOSIS — Z0000 Encounter for general adult medical examination without abnormal findings: Secondary | ICD-10-CM

## 2023-03-17 LAB — POCT URINALYSIS DIPSTICK
Bilirubin, UA: NEGATIVE
Blood, UA: NEGATIVE
Glucose, UA: NEGATIVE
Ketones, UA: POSITIVE
Leukocytes, UA: NEGATIVE
Nitrite, UA: NEGATIVE
Protein, UA: NEGATIVE
Spec Grav, UA: 1.025 (ref 1.010–1.025)
Urobilinogen, UA: 0.2 E.U./dL
pH, UA: 6 (ref 5.0–8.0)

## 2023-03-17 NOTE — Progress Notes (Signed)
Presents to [facility] for labs & EKG

## 2023-03-18 LAB — CMP12+LP+TP+TSH+6AC+CBC/D/PLT
ALT: 12 IU/L (ref 0–32)
AST: 17 IU/L (ref 0–40)
Albumin/Globulin Ratio: 1.8 (ref 1.2–2.2)
Albumin: 4.4 g/dL (ref 3.9–4.9)
Alkaline Phosphatase: 72 IU/L (ref 44–121)
BUN/Creatinine Ratio: 14 (ref 9–23)
BUN: 11 mg/dL (ref 6–24)
Basophils Absolute: 0.1 10*3/uL (ref 0.0–0.2)
Basos: 1 %
Bilirubin Total: 0.7 mg/dL (ref 0.0–1.2)
Calcium: 9.3 mg/dL (ref 8.7–10.2)
Chloride: 103 mmol/L (ref 96–106)
Chol/HDL Ratio: 3.5 ratio (ref 0.0–4.4)
Cholesterol, Total: 181 mg/dL (ref 100–199)
Creatinine, Ser: 0.79 mg/dL (ref 0.57–1.00)
EOS (ABSOLUTE): 0.1 10*3/uL (ref 0.0–0.4)
Eos: 2 %
Estimated CHD Risk: 0.6 times avg. (ref 0.0–1.0)
Free Thyroxine Index: 2.4 (ref 1.2–4.9)
GGT: 11 IU/L (ref 0–60)
Globulin, Total: 2.4 g/dL (ref 1.5–4.5)
Glucose: 83 mg/dL (ref 70–99)
HDL: 51 mg/dL (ref >39)
Hematocrit: 44 % (ref 34.0–46.6)
Hemoglobin: 14.7 g/dL (ref 11.1–15.9)
Immature Grans (Abs): 0 10*3/uL (ref 0.0–0.1)
Immature Granulocytes: 0 %
Iron: 100 ug/dL (ref 27–159)
LDH: 165 IU/L (ref 119–226)
LDL Chol Calc (NIH): 118 mg/dL — ABNORMAL HIGH (ref 0–99)
Lymphocytes Absolute: 1.7 10*3/uL (ref 0.7–3.1)
Lymphs: 33 %
MCH: 28.9 pg (ref 26.6–33.0)
MCHC: 33.4 g/dL (ref 31.5–35.7)
MCV: 87 fL (ref 79–97)
Monocytes Absolute: 0.4 10*3/uL (ref 0.1–0.9)
Monocytes: 7 %
Neutrophils Absolute: 3 10*3/uL (ref 1.4–7.0)
Neutrophils: 57 %
Phosphorus: 2.6 mg/dL — ABNORMAL LOW (ref 3.0–4.3)
Platelets: 310 10*3/uL (ref 150–450)
Potassium: 3.8 mmol/L (ref 3.5–5.2)
RBC: 5.08 x10E6/uL (ref 3.77–5.28)
RDW: 11.8 % (ref 11.7–15.4)
Sodium: 138 mmol/L (ref 134–144)
T3 Uptake Ratio: 31 % (ref 24–39)
T4, Total: 7.9 ug/dL (ref 4.5–12.0)
TSH: 1.67 u[IU]/mL (ref 0.450–4.500)
Total Protein: 6.8 g/dL (ref 6.0–8.5)
Triglycerides: 66 mg/dL (ref 0–149)
Uric Acid: 4.6 mg/dL (ref 2.6–6.2)
VLDL Cholesterol Cal: 12 mg/dL (ref 5–40)
WBC: 5.3 10*3/uL (ref 3.4–10.8)
eGFR: 94 mL/min/{1.73_m2} (ref >59)

## 2023-03-27 ENCOUNTER — Ambulatory Visit
Admission: RE | Admit: 2023-03-27 | Discharge: 2023-03-27 | Disposition: A | Discharge status: Home / Self Care | Payer: 59 | Source: Ambulatory Visit | Attending: Physician Assistant | Admitting: Physician Assistant

## 2023-03-27 DIAGNOSIS — Z1231 Encounter for screening mammogram for malignant neoplasm of breast: Secondary | ICD-10-CM | POA: Diagnosis not present
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
## 2023-04-03 ENCOUNTER — Other Ambulatory Visit (HOSPITAL_COMMUNITY)
Admission: RE | Admit: 2023-04-03 | Discharge: 2023-04-03 | Disposition: A | Discharge status: Home / Self Care | Payer: 59 | Source: Ambulatory Visit | Attending: Obstetrics and Gynecology | Admitting: Obstetrics and Gynecology

## 2023-04-03 ENCOUNTER — Encounter: Payer: Self-pay | Admitting: Obstetrics and Gynecology

## 2023-04-03 ENCOUNTER — Ambulatory Visit (INDEPENDENT_AMBULATORY_CARE_PROVIDER_SITE_OTHER): Payer: 59 | Admitting: Obstetrics and Gynecology

## 2023-04-03 VITALS — BP 110/70 | Ht 64.0 in | Wt 177.0 lb

## 2023-04-03 DIAGNOSIS — Z1151 Encounter for screening for human papillomavirus (HPV): Secondary | ICD-10-CM | POA: Diagnosis not present

## 2023-04-03 DIAGNOSIS — Z124 Encounter for screening for malignant neoplasm of cervix: Secondary | ICD-10-CM | POA: Diagnosis not present

## 2023-04-03 DIAGNOSIS — Z1231 Encounter for screening mammogram for malignant neoplasm of breast: Secondary | ICD-10-CM

## 2023-04-03 DIAGNOSIS — Z01419 Encounter for gynecological examination (general) (routine) without abnormal findings: Secondary | ICD-10-CM

## 2023-04-03 NOTE — Patient Instructions (Signed)
I value your feedback and you entrusting us with your care. If you get a [facility] patient survey, I would appreciate you taking the time to let us know about your experience today. Thank you! ? ? ?

## 2023-04-06 LAB — CYTOLOGY - PAP
Comment: NEGATIVE
Diagnosis: NEGATIVE
High risk HPV: NEGATIVE

## 2023-06-19 ENCOUNTER — Ambulatory Visit
Admission: RE | Admit: 2023-06-19 | Discharge: 2023-06-19 | Disposition: A | Discharge status: Home / Self Care | Payer: 59 | Source: Ambulatory Visit | Attending: Physician Assistant | Admitting: Physician Assistant

## 2023-06-19 ENCOUNTER — Encounter: Payer: Self-pay | Admitting: Physician Assistant

## 2023-06-19 ENCOUNTER — Ambulatory Visit: Payer: Self-pay | Admitting: Physician Assistant

## 2023-06-19 ENCOUNTER — Ambulatory Visit
Admission: RE | Admit: 2023-06-19 | Discharge: 2023-06-19 | Disposition: A | Discharge status: Home / Self Care | Payer: 59 | Attending: Physician Assistant | Admitting: Physician Assistant

## 2023-06-19 VITALS — BP 136/100 | HR 84 | Temp 97.1°F | Resp 12

## 2023-06-19 DIAGNOSIS — M4184 Other forms of scoliosis, thoracic region: Secondary | ICD-10-CM | POA: Diagnosis not present

## 2023-06-19 DIAGNOSIS — R0781 Pleurodynia: Secondary | ICD-10-CM

## 2023-06-19 MED ORDER — ORPHENADRINE CITRATE ER 100 MG PO TB12: 100.0000 mg | ORAL_TABLET | Freq: Two times a day (BID) | ORAL | 0 refills | Status: DC

## 2023-06-19 MED ORDER — NAPROXEN 500 MG PO TABS
500.0000 mg | ORAL_TABLET | Freq: Two times a day (BID) | ORAL | 0 refills | Status: DC
Start: 2023-06-19 — End: 2023-09-20
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
## 2023-09-20 ENCOUNTER — Ambulatory Visit
Admission: EM | Admit: 2023-09-20 | Discharge: 2023-09-20 | Disposition: A | Discharge status: Home / Self Care | Payer: 59 | Attending: Family Medicine | Admitting: Family Medicine

## 2023-09-20 ENCOUNTER — Ambulatory Visit: Payer: 59

## 2023-09-20 DIAGNOSIS — S161XXA Strain of muscle, fascia and tendon at neck level, initial encounter: Secondary | ICD-10-CM

## 2023-09-20 DIAGNOSIS — M79644 Pain in right finger(s): Secondary | ICD-10-CM | POA: Diagnosis not present

## 2023-09-20 DIAGNOSIS — S39012A Strain of muscle, fascia and tendon of lower back, initial encounter: Secondary | ICD-10-CM

## 2023-09-20 MED ORDER — CYCLOBENZAPRINE HCL 5 MG PO TABS
5.0000 mg | ORAL_TABLET | Freq: Three times a day (TID) | ORAL | 0 refills | Status: DC | PRN
Start: 2023-09-20 — End: 2024-04-18

## 2023-09-20 MED ORDER — NAPROXEN 500 MG PO TABS: 500.0000 mg | ORAL_TABLET | Freq: Two times a day (BID) | ORAL | 0 refills | Status: DC
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
## 2023-09-26 DIAGNOSIS — S161XXA Strain of muscle, fascia and tendon at neck level, initial encounter: Secondary | ICD-10-CM | POA: Diagnosis not present

## 2023-09-26 DIAGNOSIS — S29019A Strain of muscle and tendon of unspecified wall of thorax, initial encounter: Secondary | ICD-10-CM | POA: Diagnosis not present

## 2024-03-04 ENCOUNTER — Other Ambulatory Visit: Payer: Self-pay | Admitting: Obstetrics and Gynecology

## 2024-03-04 DIAGNOSIS — Z1231 Encounter for screening mammogram for malignant neoplasm of breast: Secondary | ICD-10-CM

## 2024-03-15 ENCOUNTER — Ambulatory Visit
Admission: RE | Admit: 2024-03-15 | Discharge: 2024-03-15 | Disposition: A | Discharge status: Home / Self Care | Source: Ambulatory Visit | Attending: Obstetrics and Gynecology

## 2024-03-15 DIAGNOSIS — D2372 Other benign neoplasm of skin of left lower limb, including hip: Secondary | ICD-10-CM | POA: Diagnosis not present

## 2024-03-15 DIAGNOSIS — Z1231 Encounter for screening mammogram for malignant neoplasm of breast: Secondary | ICD-10-CM | POA: Diagnosis not present

## 2024-03-15 DIAGNOSIS — L814 Other melanin hyperpigmentation: Secondary | ICD-10-CM | POA: Diagnosis not present

## 2024-03-19 ENCOUNTER — Encounter: Payer: Self-pay | Admitting: Obstetrics and Gynecology

## 2024-04-03 ENCOUNTER — Other Ambulatory Visit: Payer: Self-pay

## 2024-04-03 DIAGNOSIS — Z0000 Encounter for general adult medical examination without abnormal findings: Secondary | ICD-10-CM

## 2024-04-03 LAB — POCT URINALYSIS DIPSTICK
Bilirubin, UA: NEGATIVE
Blood, UA: NEGATIVE
Glucose, UA: NEGATIVE
Ketones, UA: NEGATIVE
Leukocytes, UA: NEGATIVE
Nitrite, UA: NEGATIVE
Protein, UA: NEGATIVE
Spec Grav, UA: 1.01 (ref 1.010–1.025)
Urobilinogen, UA: 0.2 U/dL
pH, UA: 6 (ref 5.0–8.0)

## 2024-04-03 NOTE — Progress Notes (Signed)
 Annual Physical scheduled with Karma Oz, PA-C tomorrow (04/04/2024).
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
## 2024-04-04 ENCOUNTER — Ambulatory Visit: Admitting: Obstetrics and Gynecology

## 2024-04-04 LAB — CMP12+LP+TP+TSH+6AC+CBC/D/PLT
ALT: 15 IU/L (ref 0–32)
AST: 19 IU/L (ref 0–40)
Albumin: 4.4 g/dL (ref 3.9–4.9)
Alkaline Phosphatase: 79 IU/L (ref 44–121)
BUN/Creatinine Ratio: 13 (ref 9–23)
BUN: 10 mg/dL (ref 6–24)
Basophils Absolute: 0 10*3/uL (ref 0.0–0.2)
Basos: 1 %
Bilirubin Total: 0.6 mg/dL (ref 0.0–1.2)
Calcium: 9.3 mg/dL (ref 8.7–10.2)
Chloride: 103 mmol/L (ref 96–106)
Chol/HDL Ratio: 3.5 ratio (ref 0.0–4.4)
Cholesterol, Total: 190 mg/dL (ref 100–199)
Creatinine, Ser: 0.8 mg/dL (ref 0.57–1.00)
EOS (ABSOLUTE): 0.1 10*3/uL (ref 0.0–0.4)
Eos: 3 %
Estimated CHD Risk: 0.5 times avg. (ref 0.0–1.0)
Free Thyroxine Index: 2.4 (ref 1.2–4.9)
GGT: 14 IU/L (ref 0–60)
Globulin, Total: 2.5 g/dL (ref 1.5–4.5)
Glucose: 85 mg/dL (ref 70–99)
HDL: 55 mg/dL (ref >39)
Hematocrit: 45.3 % (ref 34.0–46.6)
Hemoglobin: 14.9 g/dL (ref 11.1–15.9)
Immature Grans (Abs): 0 10*3/uL (ref 0.0–0.1)
Immature Granulocytes: 0 %
Iron: 73 ug/dL (ref 27–159)
LDH: 159 IU/L (ref 119–226)
LDL Chol Calc (NIH): 123 mg/dL — ABNORMAL HIGH (ref 0–99)
Lymphocytes Absolute: 1.5 10*3/uL (ref 0.7–3.1)
Lymphs: 33 %
MCH: 29.2 pg (ref 26.6–33.0)
MCHC: 32.9 g/dL (ref 31.5–35.7)
MCV: 89 fL (ref 79–97)
Monocytes Absolute: 0.3 10*3/uL (ref 0.1–0.9)
Monocytes: 6 %
Neutrophils Absolute: 2.6 10*3/uL (ref 1.4–7.0)
Neutrophils: 57 %
Phosphorus: 2.8 mg/dL — ABNORMAL LOW (ref 3.0–4.3)
Platelets: 270 10*3/uL (ref 150–450)
Potassium: 4.4 mmol/L (ref 3.5–5.2)
RBC: 5.11 x10E6/uL (ref 3.77–5.28)
RDW: 11.9 % (ref 11.7–15.4)
Sodium: 138 mmol/L (ref 134–144)
T3 Uptake Ratio: 30 % (ref 24–39)
T4, Total: 8.1 ug/dL (ref 4.5–12.0)
TSH: 1.68 u[IU]/mL (ref 0.450–4.500)
Total Protein: 6.9 g/dL (ref 6.0–8.5)
Triglycerides: 63 mg/dL (ref 0–149)
Uric Acid: 4.5 mg/dL (ref 2.6–6.2)
VLDL Cholesterol Cal: 12 mg/dL (ref 5–40)
WBC: 4.4 10*3/uL (ref 3.4–10.8)
eGFR: 91 mL/min/{1.73_m2} (ref >59)

## 2024-04-10 DIAGNOSIS — M5412 Radiculopathy, cervical region: Secondary | ICD-10-CM | POA: Diagnosis not present

## 2024-04-10 DIAGNOSIS — M9902 Segmental and somatic dysfunction of thoracic region: Secondary | ICD-10-CM | POA: Diagnosis not present

## 2024-04-10 DIAGNOSIS — M9901 Segmental and somatic dysfunction of cervical region: Secondary | ICD-10-CM | POA: Diagnosis not present

## 2024-04-10 DIAGNOSIS — M546 Pain in thoracic spine: Secondary | ICD-10-CM | POA: Diagnosis not present
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
## 2024-04-18 ENCOUNTER — Encounter: Payer: Self-pay | Admitting: Obstetrics and Gynecology

## 2024-04-18 ENCOUNTER — Ambulatory Visit (INDEPENDENT_AMBULATORY_CARE_PROVIDER_SITE_OTHER): Admitting: Obstetrics and Gynecology

## 2024-04-18 VITALS — BP 133/93 | HR 123 | Ht 64.0 in | Wt 174.0 lb

## 2024-04-18 DIAGNOSIS — Z1231 Encounter for screening mammogram for malignant neoplasm of breast: Secondary | ICD-10-CM

## 2024-04-18 DIAGNOSIS — Z01419 Encounter for gynecological examination (general) (routine) without abnormal findings: Secondary | ICD-10-CM

## 2024-04-18 NOTE — Patient Instructions (Signed)
 I value your feedback and you entrusting [place] with your care. If you get a [place] patient survey, I would appreciate you taking the time to let us know about your experience today. Thank you! ? ? ?

## 2024-05-08 DIAGNOSIS — M9902 Segmental and somatic dysfunction of thoracic region: Secondary | ICD-10-CM | POA: Diagnosis not present

## 2024-05-08 DIAGNOSIS — M5412 Radiculopathy, cervical region: Secondary | ICD-10-CM | POA: Diagnosis not present

## 2024-05-08 DIAGNOSIS — M9901 Segmental and somatic dysfunction of cervical region: Secondary | ICD-10-CM | POA: Diagnosis not present

## 2024-05-08 DIAGNOSIS — M546 Pain in thoracic spine: Secondary | ICD-10-CM | POA: Diagnosis not present
